# Patient Record
Sex: Female | Born: 1935 | Race: White | Hispanic: No | Marital: Married | Smoking: Never smoker
Health system: Southern US, Community
[De-identification: ages and names within clinical notes are randomized; demographics above are authoritative.]

## PROBLEM LIST (undated history)

## (undated) DIAGNOSIS — R011 Cardiac murmur, unspecified: Secondary | ICD-10-CM

## (undated) DIAGNOSIS — M545 Low back pain, unspecified: Secondary | ICD-10-CM

## (undated) DIAGNOSIS — G43909 Migraine, unspecified, not intractable, without status migrainosus: Secondary | ICD-10-CM

## (undated) DIAGNOSIS — Z9289 Personal history of other medical treatment: Secondary | ICD-10-CM

## (undated) DIAGNOSIS — E119 Type 2 diabetes mellitus without complications: Secondary | ICD-10-CM

## (undated) DIAGNOSIS — I8390 Asymptomatic varicose veins of unspecified lower extremity: Secondary | ICD-10-CM

## (undated) DIAGNOSIS — M810 Age-related osteoporosis without current pathological fracture: Secondary | ICD-10-CM

## (undated) DIAGNOSIS — I739 Peripheral vascular disease, unspecified: Secondary | ICD-10-CM

## (undated) DIAGNOSIS — D649 Anemia, unspecified: Secondary | ICD-10-CM

## (undated) DIAGNOSIS — I350 Nonrheumatic aortic (valve) stenosis: Secondary | ICD-10-CM

## (undated) DIAGNOSIS — R0789 Other chest pain: Secondary | ICD-10-CM

## (undated) DIAGNOSIS — I82409 Acute embolism and thrombosis of unspecified deep veins of unspecified lower extremity: Secondary | ICD-10-CM

## (undated) DIAGNOSIS — F329 Major depressive disorder, single episode, unspecified: Secondary | ICD-10-CM

## (undated) DIAGNOSIS — K589 Irritable bowel syndrome without diarrhea: Secondary | ICD-10-CM

## (undated) DIAGNOSIS — Z7901 Long term (current) use of anticoagulants: Secondary | ICD-10-CM

## (undated) DIAGNOSIS — I2510 Atherosclerotic heart disease of native coronary artery without angina pectoris: Secondary | ICD-10-CM

## (undated) DIAGNOSIS — F32A Depression, unspecified: Secondary | ICD-10-CM

## (undated) DIAGNOSIS — C4490 Unspecified malignant neoplasm of skin, unspecified: Secondary | ICD-10-CM

## (undated) DIAGNOSIS — N200 Calculus of kidney: Secondary | ICD-10-CM

## (undated) DIAGNOSIS — M1990 Unspecified osteoarthritis, unspecified site: Secondary | ICD-10-CM

## (undated) DIAGNOSIS — K219 Gastro-esophageal reflux disease without esophagitis: Secondary | ICD-10-CM

## (undated) DIAGNOSIS — I219 Acute myocardial infarction, unspecified: Secondary | ICD-10-CM

## (undated) DIAGNOSIS — G8929 Other chronic pain: Secondary | ICD-10-CM

## (undated) DIAGNOSIS — I482 Chronic atrial fibrillation, unspecified: Secondary | ICD-10-CM

## (undated) DIAGNOSIS — F419 Anxiety disorder, unspecified: Secondary | ICD-10-CM

## (undated) DIAGNOSIS — I519 Heart disease, unspecified: Secondary | ICD-10-CM

## (undated) HISTORY — DX: Other chest pain: R07.89

## (undated) HISTORY — PX: CHOLECYSTECTOMY: SHX55

## (undated) HISTORY — DX: Long term (current) use of anticoagulants: Z79.01

## (undated) HISTORY — DX: Depression, unspecified: F32.A

## (undated) HISTORY — DX: Chronic atrial fibrillation, unspecified: I48.20

## (undated) HISTORY — DX: Asymptomatic varicose veins of unspecified lower extremity: I83.90

## (undated) HISTORY — PX: HERNIA REPAIR: SHX51

## (undated) HISTORY — DX: Major depressive disorder, single episode, unspecified: F32.9

## (undated) HISTORY — PX: SKIN CANCER EXCISION: SHX779

## (undated) HISTORY — PX: APPENDECTOMY: SHX54

## (undated) HISTORY — PX: CARDIAC CATHETERIZATION: SHX172

## (undated) HISTORY — DX: Peripheral vascular disease, unspecified: I73.9

## (undated) HISTORY — DX: Heart disease, unspecified: I51.9

## (undated) HISTORY — DX: Atherosclerotic heart disease of native coronary artery without angina pectoris: I25.10

## (undated) HISTORY — DX: Calculus of kidney: N20.0

## (undated) HISTORY — DX: Irritable bowel syndrome, unspecified: K58.9

## (undated) HISTORY — PX: UMBILICAL HERNIA REPAIR: SHX196

## (undated) HISTORY — DX: Nonrheumatic aortic (valve) stenosis: I35.0

---

## 1960-06-10 DIAGNOSIS — I219 Acute myocardial infarction, unspecified: Secondary | ICD-10-CM

## 1960-06-10 DIAGNOSIS — I82409 Acute embolism and thrombosis of unspecified deep veins of unspecified lower extremity: Secondary | ICD-10-CM

## 1960-06-10 HISTORY — DX: Acute myocardial infarction, unspecified: I21.9

## 1960-06-10 HISTORY — DX: Acute embolism and thrombosis of unspecified deep veins of unspecified lower extremity: I82.409

## 1969-02-08 HISTORY — PX: ABDOMINAL HYSTERECTOMY: SHX81

## 1969-06-10 DIAGNOSIS — Z9289 Personal history of other medical treatment: Secondary | ICD-10-CM

## 1969-06-10 HISTORY — PX: PATENT FORAMEN OVALE CLOSURE: SHX2181

## 1969-06-10 HISTORY — DX: Personal history of other medical treatment: Z92.89

## 1997-11-07 ENCOUNTER — Inpatient Hospital Stay (HOSPITAL_COMMUNITY): Admission: EM | Admit: 1997-11-07 | Discharge: 1997-11-08 | Payer: Self-pay | Admitting: *Deleted

## 1999-02-26 ENCOUNTER — Encounter: Payer: Self-pay | Admitting: *Deleted

## 1999-02-26 ENCOUNTER — Ambulatory Visit (HOSPITAL_COMMUNITY): Admission: RE | Admit: 1999-02-26 | Discharge: 1999-02-26 | Payer: Self-pay | Admitting: *Deleted

## 1999-03-30 ENCOUNTER — Encounter: Payer: Self-pay | Admitting: Urology

## 1999-03-30 ENCOUNTER — Ambulatory Visit (HOSPITAL_COMMUNITY): Admission: RE | Admit: 1999-03-30 | Discharge: 1999-03-30 | Payer: Self-pay | Admitting: Urology

## 2000-08-25 ENCOUNTER — Encounter: Payer: Self-pay | Admitting: *Deleted

## 2000-08-25 ENCOUNTER — Inpatient Hospital Stay (HOSPITAL_COMMUNITY): Admission: AD | Admit: 2000-08-25 | Discharge: 2000-08-30 | Payer: Self-pay | Admitting: *Deleted

## 2000-10-08 HISTORY — PX: MITRAL VALVE REPLACEMENT: SHX147

## 2000-10-08 HISTORY — PX: TRICUSPID VALVE REPLACEMENT: SHX816

## 2000-10-20 ENCOUNTER — Inpatient Hospital Stay (HOSPITAL_COMMUNITY): Admission: RE | Admit: 2000-10-20 | Discharge: 2000-11-04 | Payer: Self-pay | Admitting: Surgery

## 2000-10-20 ENCOUNTER — Encounter (INDEPENDENT_AMBULATORY_CARE_PROVIDER_SITE_OTHER): Payer: Self-pay | Admitting: Specialist

## 2000-10-20 ENCOUNTER — Encounter: Payer: Self-pay | Admitting: Surgery

## 2000-10-23 ENCOUNTER — Encounter: Payer: Self-pay | Admitting: Surgery

## 2000-10-24 ENCOUNTER — Encounter: Payer: Self-pay | Admitting: Surgery

## 2000-10-25 ENCOUNTER — Encounter: Payer: Self-pay | Admitting: Surgery

## 2000-10-26 ENCOUNTER — Encounter: Payer: Self-pay | Admitting: Cardiothoracic Surgery

## 2000-10-27 ENCOUNTER — Encounter: Payer: Self-pay | Admitting: Cardiothoracic Surgery

## 2001-01-08 ENCOUNTER — Inpatient Hospital Stay (HOSPITAL_COMMUNITY): Admission: AD | Admit: 2001-01-08 | Discharge: 2001-01-11 | Payer: Self-pay | Admitting: Cardiology

## 2001-01-09 ENCOUNTER — Encounter: Payer: Self-pay | Admitting: Cardiology

## 2001-01-09 ENCOUNTER — Encounter: Payer: Self-pay | Admitting: Neurology

## 2002-04-02 ENCOUNTER — Ambulatory Visit (HOSPITAL_COMMUNITY): Admission: RE | Admit: 2002-04-02 | Discharge: 2002-04-02 | Payer: Self-pay

## 2002-04-02 ENCOUNTER — Encounter: Payer: Self-pay | Admitting: Urology

## 2004-06-15 ENCOUNTER — Ambulatory Visit: Payer: Self-pay | Admitting: Cardiology

## 2004-12-03 ENCOUNTER — Ambulatory Visit: Payer: Self-pay | Admitting: Cardiology

## 2005-03-07 ENCOUNTER — Ambulatory Visit: Payer: Self-pay | Admitting: Cardiology

## 2005-06-26 ENCOUNTER — Ambulatory Visit: Payer: Self-pay | Admitting: Cardiology

## 2005-07-05 ENCOUNTER — Ambulatory Visit: Payer: Self-pay | Admitting: Cardiology

## 2006-04-24 ENCOUNTER — Emergency Department (HOSPITAL_COMMUNITY): Admission: EM | Admit: 2006-04-24 | Discharge: 2006-04-24 | Payer: Self-pay | Admitting: Emergency Medicine

## 2006-04-24 ENCOUNTER — Encounter: Payer: Self-pay | Admitting: Vascular Surgery

## 2006-07-14 ENCOUNTER — Encounter: Payer: Self-pay | Admitting: Cardiology

## 2006-07-15 ENCOUNTER — Ambulatory Visit: Payer: Self-pay | Admitting: Cardiology

## 2006-07-22 ENCOUNTER — Ambulatory Visit: Payer: Self-pay | Admitting: Cardiology

## 2006-07-25 ENCOUNTER — Ambulatory Visit: Payer: Self-pay | Admitting: Cardiology

## 2006-10-02 ENCOUNTER — Ambulatory Visit: Payer: Self-pay | Admitting: Cardiology

## 2006-12-01 ENCOUNTER — Ambulatory Visit: Payer: Self-pay | Admitting: Cardiology

## 2006-12-09 ENCOUNTER — Ambulatory Visit: Payer: Self-pay | Admitting: Cardiology

## 2006-12-18 ENCOUNTER — Ambulatory Visit: Payer: Self-pay | Admitting: Physician Assistant

## 2006-12-19 ENCOUNTER — Encounter: Payer: Self-pay | Admitting: Cardiology

## 2006-12-23 ENCOUNTER — Ambulatory Visit: Payer: Self-pay | Admitting: Cardiology

## 2006-12-26 ENCOUNTER — Ambulatory Visit: Payer: Self-pay | Admitting: Cardiology

## 2006-12-29 ENCOUNTER — Ambulatory Visit: Payer: Self-pay | Admitting: Cardiology

## 2006-12-31 ENCOUNTER — Ambulatory Visit: Payer: Self-pay | Admitting: Cardiology

## 2007-01-09 ENCOUNTER — Ambulatory Visit: Payer: Self-pay | Admitting: Physician Assistant

## 2007-03-04 ENCOUNTER — Encounter: Payer: Self-pay | Admitting: Cardiology

## 2007-05-05 ENCOUNTER — Ambulatory Visit: Payer: Self-pay | Admitting: Cardiology

## 2007-06-12 ENCOUNTER — Ambulatory Visit: Payer: Self-pay | Admitting: Cardiology

## 2007-06-19 ENCOUNTER — Ambulatory Visit: Payer: Self-pay | Admitting: Cardiology

## 2007-06-24 ENCOUNTER — Ambulatory Visit: Payer: Self-pay | Admitting: Cardiology

## 2007-06-26 ENCOUNTER — Ambulatory Visit: Payer: Self-pay | Admitting: Cardiology

## 2007-10-29 ENCOUNTER — Encounter: Payer: Self-pay | Admitting: Cardiology

## 2007-12-08 ENCOUNTER — Ambulatory Visit: Payer: Self-pay | Admitting: Cardiology

## 2008-03-25 ENCOUNTER — Encounter: Payer: Self-pay | Admitting: Cardiology

## 2008-07-18 ENCOUNTER — Ambulatory Visit: Payer: Self-pay | Admitting: Cardiology

## 2008-07-18 ENCOUNTER — Encounter: Payer: Self-pay | Admitting: Cardiology

## 2008-07-18 DIAGNOSIS — G43909 Migraine, unspecified, not intractable, without status migrainosus: Secondary | ICD-10-CM | POA: Insufficient documentation

## 2008-07-18 DIAGNOSIS — I4891 Unspecified atrial fibrillation: Secondary | ICD-10-CM | POA: Insufficient documentation

## 2008-07-18 DIAGNOSIS — R002 Palpitations: Secondary | ICD-10-CM | POA: Insufficient documentation

## 2008-07-18 DIAGNOSIS — I079 Rheumatic tricuspid valve disease, unspecified: Secondary | ICD-10-CM | POA: Insufficient documentation

## 2008-07-18 DIAGNOSIS — F3289 Other specified depressive episodes: Secondary | ICD-10-CM | POA: Insufficient documentation

## 2008-07-18 DIAGNOSIS — Z9889 Other specified postprocedural states: Secondary | ICD-10-CM

## 2008-07-18 DIAGNOSIS — R413 Other amnesia: Secondary | ICD-10-CM | POA: Insufficient documentation

## 2008-08-18 ENCOUNTER — Encounter: Payer: Self-pay | Admitting: Cardiology

## 2009-01-02 ENCOUNTER — Ambulatory Visit: Payer: Self-pay | Admitting: Cardiology

## 2009-01-02 ENCOUNTER — Encounter: Payer: Self-pay | Admitting: Cardiology

## 2009-01-02 DIAGNOSIS — D500 Iron deficiency anemia secondary to blood loss (chronic): Secondary | ICD-10-CM

## 2009-01-19 ENCOUNTER — Ambulatory Visit: Payer: Self-pay | Admitting: Cardiology

## 2009-01-19 ENCOUNTER — Encounter: Payer: Self-pay | Admitting: Cardiology

## 2009-04-27 ENCOUNTER — Encounter: Payer: Self-pay | Admitting: Cardiology

## 2009-09-01 ENCOUNTER — Encounter: Payer: Self-pay | Admitting: Cardiology

## 2009-10-10 ENCOUNTER — Ambulatory Visit: Payer: Self-pay | Admitting: Cardiology

## 2010-01-22 ENCOUNTER — Telehealth (INDEPENDENT_AMBULATORY_CARE_PROVIDER_SITE_OTHER): Payer: Self-pay | Admitting: *Deleted

## 2010-04-11 ENCOUNTER — Encounter: Payer: Self-pay | Admitting: Cardiology

## 2010-04-12 ENCOUNTER — Encounter: Payer: Self-pay | Admitting: Cardiology

## 2010-04-25 ENCOUNTER — Encounter (INDEPENDENT_AMBULATORY_CARE_PROVIDER_SITE_OTHER): Payer: Self-pay | Admitting: *Deleted

## 2010-04-25 ENCOUNTER — Ambulatory Visit: Payer: Self-pay | Admitting: Physician Assistant

## 2010-04-25 DIAGNOSIS — R079 Chest pain, unspecified: Secondary | ICD-10-CM

## 2010-04-25 DIAGNOSIS — R0602 Shortness of breath: Secondary | ICD-10-CM

## 2010-04-27 ENCOUNTER — Ambulatory Visit: Payer: Self-pay | Admitting: Cardiology

## 2010-04-27 ENCOUNTER — Encounter: Payer: Self-pay | Admitting: Physician Assistant

## 2010-05-10 ENCOUNTER — Ambulatory Visit: Payer: Self-pay | Admitting: Cardiology

## 2010-07-02 ENCOUNTER — Encounter: Payer: Self-pay | Admitting: Cardiology

## 2010-07-06 ENCOUNTER — Telehealth (INDEPENDENT_AMBULATORY_CARE_PROVIDER_SITE_OTHER): Payer: Self-pay | Admitting: *Deleted

## 2010-07-09 ENCOUNTER — Ambulatory Visit
Admission: RE | Admit: 2010-07-09 | Discharge: 2010-07-09 | Payer: Self-pay | Source: Home / Self Care | Attending: Cardiology | Admitting: Cardiology

## 2010-07-09 DIAGNOSIS — R5381 Other malaise: Secondary | ICD-10-CM | POA: Insufficient documentation

## 2010-07-09 DIAGNOSIS — R5383 Other fatigue: Secondary | ICD-10-CM

## 2010-07-09 DIAGNOSIS — I951 Orthostatic hypotension: Secondary | ICD-10-CM | POA: Insufficient documentation

## 2010-07-09 DIAGNOSIS — I059 Rheumatic mitral valve disease, unspecified: Secondary | ICD-10-CM | POA: Insufficient documentation

## 2010-07-10 NOTE — Letter (Signed)
Summary: Lexiscan or Dobutamine Pharmacist, community at [facility]  [address]   [place], [place] [address]   Phone: [phone]  Fax: [phone]      [facility]  Lexiscan or Dobutamine Cardiolite Strss Test    [place]  Appointment Date:_  Appointment Time:_  Your doctor has ordered a CARDIOLITE STRESS TEST using a medication to stimulate exercise so that you will not have to walk on the treadmill to determine the condition of your heart during stress. If you take blood pressure medication, ask your doctor if you should take it the day of your test. You should not have anything to eat or drink at least 4 hours before your test is scheduled, and no caffeine, including decaffeinated tea and coffee, chocolate, and soft drinks for 24 hours before your test.  You will need to register at the Outpatient/Main Entrance at the hospital 15 minutes before your appointment time. It is a good idea to bring a copy of your order with you. They will direct you to the Diagnostic Imaging (Radiology) Department.  You will be asked to undress from the waist up and given a hospital gown to wear, so dress comfortably from the waist down for example: Sweat pants, shorts, or skirt Rubber soled lace up shoes (tennis shoes)  Plan on about three hours from registration to release from the hospital  You may take your medications with water the morning of your test.

## 2010-07-10 NOTE — Assessment & Plan Note (Signed)
Summary: 6 MO FU R/S FROM NO SHOW   Visit Type:  Follow-up Primary Provider:  Dr. Sherril Croon  CC:  some sob .  History of Present Illness: the patient is a 77 year old female with a history of mitral and tricuspid valve replacement. The patient denies any chest pain. She does report some increasing shortness of breath over the last month. This appears to occur mainly at rest. She states that she is under a lot of stress. She does become dyspneic when she gets anxious. She denies however any exertional chest pain or shortness of breath. From a cardiovascular point she's stable. She reports no palpitations presyncope or syncope.she has a history of atrial fibrillation but has remained in normal sinus rhythm.  Clinical Review Panels:  Echocardiogram Echocardiogram Transthoracic Echocardiogram                Conclusions:         1. The study quality is good.          2. The left ventricular chamber size is normal.         3. Global left ventricular wall motion and contractility are within         normal limits. The         4. EF is 55%.         5. There is mild aortic regurgitation.          6. There is thickening of the aortic valve with decreased opening. The         right cusp moves better than the other cusps. There is very mild AS.         7. A mechanical prosthetic mitral valve is present. It appears to work         well.         8. A pericardial effusion is visualized.         9. There is a small pericardial effusion.         10. The pericardial effusion is seen adjacent to the left ventricle.         11. The inferior vena cava appears normal.         12. There is a greater than 50% respiratory change in the inferior         vena cava dimension.          JEFFREY NMN KATZ, MD       (01/19/2009)    Current Medications (verified): 1)  Metoprolol Succinate 100 Mg Xr24h-Tab (Metoprolol Succinate) .... Take One Tablet By Mouth Daily 2)  Warfarin Sodium 5 Mg Tabs (Warfarin Sodium) ....  Use As Directed By Dr. Sherril Croon 3)  Glycron 3 Mg Tabs (Glyburide Micronized) .... Take 1 Tab Two Times A Day 4)  Furosemide 40 Mg Tabs (Furosemide) .... Take One Tablet By Mouth Daily. 5)  Evista 60 Mg Tabs (Raloxifene Hcl) .... Take 1 Tablet By Mouth Once A Day 6)  Nexium 40 Mg Cpdr (Esomeprazole Magnesium) .... Take 1 Tablet By Mouth Once A Day 7)  Klor-Con 10 10 Meq Cr-Tabs (Potassium Chloride) .... Take 1 Tab Daily 8)  Ultracet 37.5-325 Mg Tabs (Tramadol-Acetaminophen) .... As Needed Pain 9)  Alprazolam 0.5 Mg Tabs (Alprazolam) .... As Needed 10)  Stool Softener 100 Mg Caps (Docusate Sodium) .... As Needed Constipation 11)  Metoprolol Tartrate 50 Mg Tabs (Metoprolol Tartrate) .... Take 1/2 Tablet By Mouth 12)  Fexofenadine Hcl 180 Mg Tabs (Fexofenadine Hcl) .... Take 1 Tab  Daily 13)  Ultracet 37.5-325 Mg Tabs (Tramadol-Acetaminophen) .... Take As Directed 14)  Simvastatin 40 Mg Tabs (Simvastatin) .... Take 1 Tab Daily 15)  Metoprolol Tartrate 25 Mg Tabs (Metoprolol Tartrate) .... May Use Up To Two Times A Day As Needed  Allergies (verified): No Known Drug Allergies  Past History:  Past Medical History: Last updated: 07/18/2008 atrial fibrillation and atrial flutter  Rheumatic heart disease chronic Coumadin therapy As he is in a status post St. Jude's mitral tricuspid valve replacement May of 2002 Closure of patent foramen ovale valve surgery Normal LV function History of congestive heart failure Normal coronary arteries by catheterization March 2002 chronic dyspnea on exertion stable dyslipidemia Diabetes mellitus  Family History: Last updated: 01/02/2009 noncontributory  Social History: Last updated: 01/02/2009 the patient denies any tobacco use  Risk Factors: Smoking Status: never (01/02/2009)  Review of Systems       The patient complains of shortness of breath and anxiety.  The patient denies fatigue, malaise, fever, weight gain/loss, vision loss, decreased hearing,  hoarseness, chest pain, palpitations, prolonged cough, wheezing, sleep apnea, coughing up blood, abdominal pain, blood in stool, nausea, vomiting, diarrhea, heartburn, incontinence, blood in urine, muscle weakness, joint pain, leg swelling, rash, skin lesions, headache, fainting, dizziness, depression, enlarged lymph nodes, easy bruising or bleeding, and environmental allergies.    Vital Signs:  Patient profile:   77 year old female Weight:      159 pounds BMI:     27.39 Pulse rate:   68 / minute BP sitting:   94 / 54  (right arm)  Vitals Entered By: Dreama Saa, CNA (Oct 10, 2009 9:58 AM)  Physical Exam  Additional Exam:  General: Well-developed, well-nourished in no distress head: Normocephalic and atraumatic eyes PERRLA/EOMI intact, conjunctiva and lids normal nose: No deformity or lesions mouth normal dentition, normal posterior pharynx neck: Supple, no JVD.  No masses, thyromegaly or abnormal cervical nodes lungs: Normal breath sounds bilaterally without wheezing.  Normal percussion heart: regular rate and rhythm with normal closing click of S1 and normal S2.Marland Kitchen  PMI is normal.  No pathological murmurs abdomen: Normal bowel sounds, abdomen is soft and nontender without masses, organomegaly or hernias noted.  No hepatosplenomegaly musculoskeletal: Back normal, normal gait muscle strength and tone normal pulsus: Pulse is normal in all 4 extremities Extremities: No peripheral pitting edema neurologic: Alert and oriented x 3 skin: Intact without lesions or rashes cervical nodes: No significant adenopathy psychologic: Normal affect    Impression & Recommendations:  Problem # 1:  ATRIAL FLUTTER, CHRONIC (ICD-427.32) no recurrent atrial arrhythmias. The patient remains in normal sinus rhythm.the patient can use p.r.n. the Toprol for palpitations as needed Her updated medication list for this problem includes:    Metoprolol Succinate 100 Mg Xr24h-tab (Metoprolol succinate) .Marland Kitchen...  Take one tablet by mouth daily    Warfarin Sodium 5 Mg Tabs (Warfarin sodium) ..... Use as directed by dr. Sherril Croon    Metoprolol Tartrate 50 Mg Tabs (Metoprolol tartrate) .Marland Kitchen... Take 1/2 tablet by mouth    Metoprolol Tartrate 25 Mg Tabs (Metoprolol tartrate) ..... May use up to two times a day as needed  Problem # 2:  COUMADIN THERAPY (ICD-V58.61) patient will continue on Coumadin for atrial fibrillation and prosthetic valve replacement  Problem # 3:  MITRAL VALVE REPLACEMENT, HX OF (ICD-V15.1) no pathological murmurs noted  Problem # 4:  TRICUSPID VALVE DISORDER (ICD-397.0) no pathological murmurs noted. Her updated medication list for this problem includes:    Metoprolol  Succinate 100 Mg Xr24h-tab (Metoprolol succinate) .Marland Kitchen... Take one tablet by mouth daily    Furosemide 40 Mg Tabs (Furosemide) .Marland Kitchen... Take one tablet by mouth daily.    Metoprolol Tartrate 50 Mg Tabs (Metoprolol tartrate) .Marland Kitchen... Take 1/2 tablet by mouth    Metoprolol Tartrate 25 Mg Tabs (Metoprolol tartrate) ..... May use up to two times a day as needed straight leg raising at an [place] and a half hours continue her son who consulted and he is asking me to assist in this patient with some benign to my notes and notes from  Patient Instructions: 1)  Metoprolol tart 25mg  - may use up to two times a day as needed  2)  Follow up in  6 months Prescriptions: METOPROLOL TARTRATE 25 MG TABS (METOPROLOL TARTRATE) may use up to two times a day as needed  #30 x 2   Entered by:   Hoover Brunette, LPN   Authorized by:   Lewayne Bunting, MD, [facility]   Signed by:   Hoover Brunette, LPN on 16/03/9603   Method used:   Electronically to        [facility]* (retail)       [address]       [place], [place]  [address]       Ph: [phone]       Fax: [phone]   RxID:   [id number]

## 2010-07-10 NOTE — Medication Information (Signed)
Summary: RX Folder/ [facility]S [facility]  RX Folder/ [facility]   Imported By: Dorise Hiss 04/12/2010 16:18:58  _____________________________________________________________________  External Attachment:    Type:   Image     Comment:   External Document

## 2010-07-10 NOTE — Letter (Signed)
Summary: External Correspondence/ OFFICE VISIT [facility]  External Correspondence/ OFFICE VISIT [facility]   Imported By: Dorise Hiss 04/12/2010 16:17:07  _____________________________________________________________________  External Attachment:    Type:   Image     Comment:   External Document

## 2010-07-10 NOTE — Assessment & Plan Note (Signed)
Summary: 6 mo fu vyas requested sooner appt   Visit Type:  Follow-up Primary Provider:  Dr. Sherril Croon   History of Present Illness: patient presents today ahead of scheduled follow up with Dr. Andee Lineman later this month, per Dr. Bonnita Levan request. She was last seen here in the clinic in May, by Dr. Andee Lineman.  Patient complains of persistent dyspnea "all the time", for the past month. She also has occasional chest pain, with a particularly severe episode 3 weeks ago. This occurred while sitting, and felt like a "cramp", with some extension into the left arm. It lasted 5 minutes.   Patient was recently seen in the clinic by Dr. Sherril Croon; labs were ordered, and notable for potassium 4.8, BUN 23, creatinine 1.4, hemoglobin 10.5, and hematocrit 32.  Patient also complains of occasional palpitations. EKG today suggest atypical atrial flutter with controlled VR. She has history of PAF/flutter, and was in NSR when last seen.  [facility], and was advised to have at least one tooth pulled, in addition to general cleaning. This is scheduled for December 1. They are awaiting clearance from our standpoint to proceed.  Preventive Screening-Counseling & Management  Alcohol-Tobacco     Smoking Status: never  Current Medications (verified): 1)  Metoprolol Succinate 100 Mg Xr24h-Tab (Metoprolol Succinate) .... Take One Tablet By Mouth Daily 2)  Warfarin Sodium 5 Mg Tabs (Warfarin Sodium) .... Use As Directed By Dr. Sherril Croon 3)  Glycron 3 Mg Tabs (Glyburide Micronized) .... Take 1 Tab Two Times A Day 4)  Furosemide 40 Mg Tabs (Furosemide) .... Take One Tablet By Mouth Daily. 5)  Evista 60 Mg Tabs (Raloxifene Hcl) .... Take 1 Tablet By Mouth Once A Day 6)  Nexium 40 Mg Cpdr (Esomeprazole Magnesium) .... Take 1 Tablet By Mouth Once A Day 7)  Klor-Con 10 10 Meq Cr-Tabs (Potassium Chloride) .... Take 1 Tab Daily 8)  Ultracet 37.5-325 Mg Tabs (Tramadol-Acetaminophen) .... As  Needed Pain 9)  Alprazolam 0.5 Mg Tabs (Alprazolam) .... As Needed 10)  Stool Softener 100 Mg Caps (Docusate Sodium) .... As Needed Constipation 11)  Metoprolol Tartrate 50 Mg Tabs (Metoprolol Tartrate) .... Take 1/2 Tablet By Mouth 12)  Fexofenadine Hcl 180 Mg Tabs (Fexofenadine Hcl) .... Take 1 Tab Daily 13)  Ultracet 37.5-325 Mg Tabs (Tramadol-Acetaminophen) .... Take As Directed 14)  Simvastatin 40 Mg Tabs (Simvastatin) .... Take 1 Tab Daily 15)  Metoprolol Tartrate 25 Mg Tabs (Metoprolol Tartrate) .... May Use Up To Two Times A Day As Needed  Allergies (verified): No Known Drug Allergies  Comments:  Nurse/Medical Assistant: The patient is currently on medications but does not know the name or dosage at this time. Instructed to contact our office with details. Will update medication list at that time.  Past History:  Past Medical History: Last updated: 07/18/2008 atrial fibrillation and atrial flutter  Rheumatic heart disease chronic Coumadin therapy As he is in a status post St. Jude's mitral tricuspid valve replacement May of 2002 Closure of patent foramen ovale valve surgery Normal LV function History of congestive heart failure Normal coronary arteries by catheterization March 2002 chronic dyspnea on exertion stable dyslipidemia Diabetes mellitus  Review of Systems       No fevers, chills, hemoptysis, dysphagia, melena, hematocheezia, hematuria, rash, claudication, orthopnea, pnd, pedal edema. complains of chronic fatigue. All other systems negative.   Vital Signs:  Patient profile:   77 year old female Height:      64 inches  Weight:      156 pounds Pulse rate:   85 / minute BP sitting:   99 / 63  (left arm) Cuff size:   regular  Vitals Entered By: Carlye Grippe (April 25, 2010 1:59 PM)   Physical Exam  Additional Exam:  GEN: 77 year old female, frail appearing, but in no distress HEENT: NCAT,PERRLA,EOMI NECK: palpable pulses, no bruits; no JVD; no  TM LUNGS: CTA bilaterally HEART: RRR (S1S2); grade 2/6 systolic murmur; crisp S1 click ABD: soft, NT; intact BS EXT: intact distal pulses; no edema SKIN: warm, dry MUSC: no obvious deformity NEURO: A/O (x3)     EKG  Procedure date:  04/25/2010  Findings:      atypical atrial flutter at 76 bpm with variable conduction; isolated PVC, and nonspecific ST changes  Impression & Recommendations:  Problem # 1:  CHEST PAIN (ICD-786.50)  Will evaluate further with a Lexis scan Cardiolite to rule out underlying ischemia. Patient presents with persistent shortness of breath, which may be an anginal equivalent. she has history of normal coronary arteries by catheterization in 2002, and had a negative Cardiolite in 2009. We will also order a 2-D echocardiogram for reassessment of LVF, and integrity of her prosthetic valves. Of note, she is on chronic Coumadin, followed by Dr. Sherril Croon, and would require heparin overlap, if she needs to come off Coumadin for any dental procedure. We will have the patient return to the clinic in 2 weeks, per review of study results, and we'll then make final recommendations regarding scheduled dental surgery.  Problem # 2:  SHORTNESS OF BREATH (ICD-786.05)  patient has history of normal left ventricular function and normal coronary arteries. Proceed with diagnostic testing, as outlined above  Problem # 3:  ATRIAL FLUTTER, CHRONIC (ICD-427.32)  patient currently is in atypical flutter rhythm with controlled VR. She is on chronic Coumadin. This may be related to her recent development of persistent dyspnea  Problem # 4:  MITRAL VALVE REPLACEMENT, HX OF (ICD-V15.1) Assessment: Comment Only  Other Orders: EKG w/ Interpretation (93000) 2-D Echocardiogram (2D Echo) Nuclear Med (Nuc Med)  Patient Instructions: 1)  Follow up with Dr. Earnestine Leys on [place], Dec 1st at 2:30pm 2)  Your physician has requested that you have an echocardiogram.  Echocardiography is a painless  test that uses sound waves to create images of your heart. It provides your doctor with information about the size and shape of your heart and how well your heart's chambers and valves are working.  This procedure takes approximately one hour. There are no restrictions for this procedure. 3)  Your physician has requested that you have an Best boy.  For further information please visit [email or web address]  Please follow instruction sheet, as given.

## 2010-07-10 NOTE — Progress Notes (Signed)
Summary: feeling tired   Phone Note Call from Patient   Summary of Call: Message left on voice mail late Friday evening.  Attempted to reach pt this morning. Left message to return call at 9:53.  Return call left on voice mail. Attempted to reach pt. again, but phone was busy. Hoover Brunette, LPN  January 22, 2010 3:17 PM   Follow-up for Phone Call        States that she has just not been feeling good lately, no energy, tired, run down feeling.  Requesting to see GD on the same day as her husband on 8/26.  Dr. Andee Lineman does not have any other appointments available that day.  Advised her that we had just seen her in May and her cardiac condition was stable.  States she has only used her as needed Metoprolol occas.   Advised her to see her PMD for evaluation and if he feels you need to be seen sooner, have him call to request.  Patient verbalized understanding.  Follow-up by: Hoover Brunette, LPN,  January 23, 2010 10:26 AM

## 2010-07-10 NOTE — Letter (Signed)
Summary: Appointment -missed   HeartCare at [facility] [address]   [place], [place] [address]   Phone: [phone]  Fax: [phone]     September 01, 2009 MRN: [id number]     Elizabeth Wallace [address] [place], [place]  [address]      Dear [name],  Our records indicate you missed your appointment on September 01, 2009                        with Dr.  Andee Lineman.   It is very important that we reach you to reschedule this appointment. We look forward to participating in your health care needs.   Please contact us at the number listed above at your earliest convenience to reschedule this appointment.   Sincerely,    Glass blower/designer

## 2010-07-11 ENCOUNTER — Encounter: Payer: Self-pay | Admitting: Cardiology

## 2010-07-12 NOTE — Assessment & Plan Note (Signed)
Summary: 2 WK F/U PER GENE-MUST SEE GD ONLY-JM   Visit Type:  Follow-up Primary Broderic Bara:  Dr. 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 She also had a closure of a patent foramen ovale. She has normal LV function and normal coronary arteries by catheterization March of 2002.the patient had a recent Cardiolite study with small apical defect. However she has no recurrent substernal chest pain and she is being treated medically. An echocardiogram demonstrated moderate aortic stenosis and a well-seated mitral valve and tricuspid valve replacement. She remains in chronic atrial fibrillation. At this point we are not proceeding with cardioversion. The patient does not take her stat and every day because she feels it is related to gas and bloating. She is in process of rescheduling colonoscopy. I explained to the patient that she will need bridging in the setting of her mitral valve and tricuspid valve replacement. She reports no bowel symptoms that could be consistent with IBS.  The cardiac perspective she started doing quite well. She has no palpitations. She got chronic mild dyspnea. She denies any palpitations or syncope  Preventive Screening-Counseling & Management  Alcohol-Tobacco     Smoking Status: never  Current Medications (verified): 1)  Metoprolol Succinate 100 Mg Xr24h-Tab (Metoprolol Succinate) .... Take One Tablet By Mouth Daily 2)  Warfarin Sodium 5 Mg Tabs (Warfarin Sodium) .... Use As Directed By Dr. Sherril Croon 3)  Glycron 3 Mg Tabs (Glyburide Micronized) .... Take 1 Tab Two Times A Day 4)  Furosemide 40 Mg Tabs (Furosemide) .... Take One Tablet By Mouth Daily. 5)  Evista 60 Mg Tabs (Raloxifene Hcl) .... Take 1 Tablet By Mouth Once A Day 6)  Nexium 40 Mg Cpdr (Esomeprazole Magnesium) .... Take 1 Tablet By Mouth Once A Day 7)  Klor-Con 10 10 Meq Cr-Tabs (Potassium  Chloride) .... Take 1 Tab Daily 8)  Ultracet 37.5-325 Mg Tabs (Tramadol-Acetaminophen) .... As Needed Pain 9)  Alprazolam 0.5 Mg Tabs (Alprazolam) .... As Needed 10)  Stool Softener 100 Mg Caps (Docusate Sodium) .... As Needed Constipation 11)  Metoprolol Tartrate 50 Mg Tabs (Metoprolol Tartrate) .... Take 1/2 Tablet By Mouth 12)  Fexofenadine Hcl 180 Mg Tabs (Fexofenadine Hcl) .... Take 1 Tab Daily 13)  Ultracet 37.5-325 Mg Tabs (Tramadol-Acetaminophen) .... Take As Directed 14)  Simvastatin 40 Mg Tabs (Simvastatin) .... Take 1 Tab Daily 15)  Metoprolol Tartrate 25 Mg Tabs (Metoprolol Tartrate) .... May Use Up To Two Times A Day As Needed 16)  Dicyclomine Hcl 20 Mg Tabs (Dicyclomine Hcl) .... May Take One Tab Up Three Times A Day  60 Minutes Prior To Meals As Needed  Allergies (verified): No Known Drug Allergies  Comments:  Nurse/Medical Assistant: The patient is currently on medications but does not know the name or dosage at this time. Instructed to contact our office with details. Will update medication list at that time.  Past History:  Past Medical History: Last updated: 07/18/2008 atrial fibrillation and atrial flutter  Rheumatic heart disease chronic Coumadin therapy As he is in a status post St. Jude's mitral tricuspid valve replacement May of 2002 Closure of patent foramen ovale valve surgery Normal LV function History of congestive heart failure Normal coronary arteries by catheterization March 2002 chronic dyspnea on exertion stable dyslipidemia Diabetes mellitus  Family History: Last updated: 05/10/2010 Negative FH of Diabetes, Hypertension, or Coronary Artery Disease  Social History: Last updated:  01/02/2009 the patient denies any tobacco use  Risk Factors: Smoking Status: never (05/10/2010)  Family History: Reviewed history from 01/02/2009 and no changes required. Negative FH of Diabetes, Hypertension, or Coronary Artery Disease  Social  History: Reviewed history from 01/02/2009 and no changes required. the patient denies any tobacco use  Vital Signs:  Patient profile:   77 year old female Height:      64 inches Weight:      156 pounds Pulse rate:   66 / minute BP sitting:   96 / 67  (left arm) Cuff size:   large  Vitals Entered By: Carlye Grippe (May 10, 2010 2:23 PM)  Physical Exam  Additional Exam:  GEN: 77 year old female, frail appearing, but in no distress HEENT: NCAT,PERRLA,EOMI NECK: palpable pulses, no bruits; no JVD; no TM LUNGS: CTA bilaterally HEART: RRR (S1S2); grade 2/6 systolic murmur; crisp S1 click ABD: soft, NT; intact BS EXT: intact distal pulses; no edema SKIN: warm, dry MUSC: no obvious deformity NEURO: A/O (x3)     Impression & Recommendations:  Problem # 1:  SHORTNESS OF BREATH (ICD-786.05) stable no evidence of heart failure. Continue current dose of Lasix. Her updated medication list for this problem includes:    Metoprolol Succinate 100 Mg Xr24h-tab (Metoprolol succinate) .Marland Kitchen... Take one tablet by mouth daily    Furosemide 40 Mg Tabs (Furosemide) .Marland Kitchen... Take one tablet by mouth daily.    Metoprolol Tartrate 50 Mg Tabs (Metoprolol tartrate) .Marland Kitchen... Take 1/2 tablet by mouth    Metoprolol Tartrate 25 Mg Tabs (Metoprolol tartrate) ..... May use up to two times a day as needed  Problem # 2:  COUMADIN THERAPY (ICD-V58.61) the patient will remain on Coumadin therapy for her mitral valve and tricuspid valve replacement. She has a mechanical prosthesis in she will need bridging with Lovenox or heparin if she scheduled for colonoscopy in the future.  Problem # 3:  MITRAL VALVE REPLACEMENT, HX OF (ICD-V15.1) no evidence of valve dysfunction.  Problem # 4:  TRICUSPID VALVE DISORDER (ICD-397.0) Assessment: Comment Only  Her updated medication list for this problem includes:    Metoprolol Succinate 100 Mg Xr24h-tab (Metoprolol succinate) .Marland Kitchen... Take one tablet by mouth daily     Furosemide 40 Mg Tabs (Furosemide) .Marland Kitchen... Take one tablet by mouth daily.    Metoprolol Tartrate 50 Mg Tabs (Metoprolol tartrate) .Marland Kitchen... Take 1/2 tablet by mouth    Metoprolol Tartrate 25 Mg Tabs (Metoprolol tartrate) ..... May use up to two times a day as needed  Other Orders: EKG w/ Interpretation (93000)  Patient Instructions: 1)  Your physician recommends that you continue on your current medications as directed. Please refer to the Current Medication list given to you today. 2)  Dicyclomine 20mg   3)  Follow up in  6 months Prescriptions: DICYCLOMINE HCL 20 MG TABS (DICYCLOMINE HCL) may take one tab up three times a day  60 minutes prior to meals as needed  #60 x 0   Entered by:   Hoover Brunette, LPN   Authorized by:   Lewayne Bunting, MD, [facility]   Signed by:   Hoover Brunette, LPN on 16/03/9603   Method used:   Electronically to        [facility] (retail)       [address]       [place], [place]  [address]       Ph: [phone]       Fax:  [phone]   RxID:   [id number] DICYCLOMINE HCL 20 MG TABS (DICYCLOMINE HCL) may take one tab up three times a day  60 minutes prior to meals as needed  #60 x 0   Entered by:   Hoover Brunette, LPN   Authorized by:   Lewayne Bunting, MD, [facility]   Signed by:   Hoover Brunette, LPN on 16/60/6301   Method used:   Electronically to        [facility] (retail)       [address]       [place], [place]  [address]       Ph: [phone]       Fax: [phone]   RxID:   [id number]  Above script to [facility] cancelled.  Patient prefers [facility] in [place]. Hoover Brunette, LPN  May 10, 2010 3:18 PM

## 2010-07-16 ENCOUNTER — Encounter: Payer: Self-pay | Admitting: Cardiology

## 2010-07-16 DIAGNOSIS — I359 Nonrheumatic aortic valve disorder, unspecified: Secondary | ICD-10-CM

## 2010-07-18 NOTE — Consult Note (Signed)
Summary: CARDIOLOGY CONSULT/ [facility]  CARDIOLOGY CONSULT/ [facility]   Imported By: Zachary George 07/09/2010 09:53:06  _____________________________________________________________________  External Attachment:    Type:   Image     Comment:   External Document

## 2010-07-18 NOTE — Assessment & Plan Note (Signed)
Summary: Elizabeth Wallace  --agh   Visit Type:  Follow-up Primary Provider:  Dr. Sherril Croon   History of Present Illness: the patient is a 77 year old female with a history of chronic atrial fibrillation, rheumatic heart disease, status post St. Jude mitral valve replacement and a bioprosthetic tricuspid valve replacement.  The patient and Coumadin therapy.  She also had closure of a patent foramen ovale.  She has normal LV function.  She had normal coronary arteries by catheterization in 2002 and a stress test approximately a year ago which showed a very small area of apical ischemia November 2011.  The patient was recently admitted to [facility] with dizziness and associated hypoglycemia blood sugar was 65 g percent.  The patient was also over anticoagulated she was placed on antibiotics.  There is no follow-up PT/INR as of date.  The patient states that she feels tired all the time.  She has frequent episodes of dizziness particularly when standing up.  She states on Friday she was trying to hang some curtains and she felt extremely weak and dizzy and almost passed out.  She denies however any true loss of consciousness.  She also talked with episodes of " cramping chest pain".  This typically lasts a few minutes but there is no exertional component associated with it.  There is also no shortness of breath or diaphoresis.  The patient stated she feels thirsty all the time.  She does take 40 monos of Lasix daily.  Her LV function however is normal.  Preventive Screening-Counseling & Management  Alcohol-Tobacco     Smoking Status: never  Current Medications (verified): 1)  Metoprolol Succinate 50 Mg Xr24h-Tab (Metoprolol Succinate) .... Take 1 1/2 Tabs (75mg ) Daily 2)  Warfarin Sodium 5 Mg Tabs (Warfarin Sodium) .... Use As Directed By Dr. Sherril Croon 3)  Glycron 3 Mg Tabs (Glyburide Micronized) .... Take 1 Tab Once A Day 4)  Furosemide 40 Mg Tabs (Furosemide) .... Take 1/2 Tab (20mg ) Daily 5)  Evista 60 Mg Tabs  (Raloxifene Hcl) .... Take 1 Tablet By Mouth Once A Day 6)  Nexium 40 Mg Cpdr (Esomeprazole Magnesium) .... Take 1 Tablet By Mouth Once A Day 7)  Klor-Con 10 10 Meq Cr-Tabs (Potassium Chloride) .... Take 1 Tab Daily 8)  Ultracet 37.5-325 Mg Tabs (Tramadol-Acetaminophen) .... As Needed Pain 9)  Alprazolam 0.5 Mg Tabs (Alprazolam) .... As Needed 10)  Simvastatin 40 Mg Tabs (Simvastatin) .... Take 1 Tab Daily 11)  Metoprolol Tartrate 25 Mg Tabs (Metoprolol Tartrate) .... May Use Up To Two Times A Day As Needed 12)  Dicyclomine Hcl 20 Mg Tabs (Dicyclomine Hcl) .... May Take One Tab Up Three Times A Day  60 Minutes Prior To Meals As Needed 13)  Carafate 1 Gm/73ml Susp (Sucralfate) .... Use Three Times A Day As Directed As Needed 14)  Miralax  Powd (Polyethylene Glycol 3350) .Marland Kitchen.. 17gm Mixed With 8oz Water or Juice Daily As Needed  Allergies (verified): 1)  ! Percocet  Comments:  Nurse/Medical Assistant: The patient's medications and allergies were verbally reviewed with the patient and were updated in the Medication and Allergy Lists.  Past History:  Past Medical History: atrial fibrillation and atrial flutter  Rheumatic heart disease chronic Coumadin therapy As he is in a status post St. Jude's mitral tricuspid valve replacement May of 2002 Closure of patent foramen ovale valve surgery Normal LV function History of congestive heart failure Normal coronary arteries by catheterization March 2002 chronic dyspnea on exertion stable dyslipidemia Diabetes mellitus admission January  2012 severe hypoglycemia, atypical chest pain ruled out for myocardial infarction as well as over anticoagulation due to recent antibiotics echocardiogram November 2011 normal LV function normal functioning mitral andtricuspid prosthesis.  Moderate aortic stenosis Cardiolite study November 2011 low risk with a very small area of apical ischemia.  Review of Systems       The patient complains of fatigue, weight  gain/loss, shortness of breath, muscle weakness, dizziness, and depression.  The patient denies malaise, fever, vision loss, decreased hearing, hoarseness, chest pain, palpitations, prolonged cough, wheezing, sleep apnea, coughing up blood, abdominal pain, blood in stool, nausea, vomiting, diarrhea, heartburn, incontinence, blood in urine, joint pain, leg swelling, rash, skin lesions, headache, fainting, anxiety, enlarged lymph nodes, easy bruising or bleeding, and environmental allergies.    Vital Signs:  Patient profile:   77 year old female Height:      64 inches Weight:      156 pounds BMI:     26.87 O2 Sat:      97 % on Room air Pulse rate:   65 / minute Pulse (ortho):   66 / minute BP sitting:   116 / 64  (left arm) BP standing:   10 / 67 Cuff size:   regular  Vitals Entered By: Carlye Grippe (July 09, 2010 10:58 AM)  Nutrition Counseling: Patient's BMI is greater than 25 and therefore counseled on weight management options.  O2 Flow:  Room air  Serial Vital Signs/Assessments:  Time      Position  BP       Pulse  Resp  Temp     By 11:58 AM  Lying RA  102/66   62                    [facility], LPN 16:10 AM  Sitting   101/60   60                    [place], LPN 96:04 AM  Standing  10/67    66                    [place], LPN  Comments: 54:09 AM after 3 min:  118/71  64 after 5 min:  11/73  65  By: Hoover Brunette, LPN    Physical Exam  Additional Exam:  GEN: 77 year old female, frail appearing, but in no distress HEENT: NCAT,PERRLA,EOMI NECK: palpable pulses, no bruits; no JVD; no TM LUNGS: CTA bilaterally HEART: RRR (S1S2); grade 2/6 systolic murmur; crisp S1 click ABD: soft, NT; intact BS EXT: intact distal pulses; no edema SKIN: warm, dry MUSC: no obvious deformity NEURO: A/O (x3)     Impression & Recommendations:  Problem # 1:  COUMADIN THERAPY (ICD-V58.61) the patient was over anticoagulatedto her recent hospitalization which was placed on  antibiotics.  We will check a PT-INR today with further follow-up as per her primary care physician.  She is currently off antibiotics  Problem # 2:  ATRIAL FLUTTER, CHRONIC (ICD-427.32) the patient is in permanent atypical atrial flutter.  However she is rate controlled.  She reports no palpitations.  There are no plans for cardioversion her flutter is atypical and likely not particularly amenable to flutter ablation.  Of note is also the patient is a tricuspid valve replacement. The following medications were removed from the medication list:    Metoprolol Tartrate 50 Mg Tabs (Metoprolol tartrate) .Marland Kitchen... Take 1/2 tablet by mouth Her updated medication list for this  problem includes:    Metoprolol Succinate 50 Mg Xr24h-tab (Metoprolol succinate) .Marland Kitchen... Take 1 1/2 tabs (75mg ) daily    Warfarin Sodium 5 Mg Tabs (Warfarin sodium) ..... Use as directed by dr. Sherril Croon    Metoprolol Tartrate 25 Mg Tabs (Metoprolol tartrate) ..... May use up to two times a day as needed  Problem # 3:  PALPITATIONS (ICD-785.1) no recurrent palpitations. The following medications were removed from the medication list:    Metoprolol Tartrate 50 Mg Tabs (Metoprolol tartrate) .Marland Kitchen... Take 1/2 tablet by mouth Her updated medication list for this problem includes:    Metoprolol Succinate 50 Mg Xr24h-tab (Metoprolol succinate) .Marland Kitchen... Take 1 1/2 tabs (75mg ) daily    Warfarin Sodium 5 Mg Tabs (Warfarin sodium) ..... Use as directed by dr. Sherril Croon    Metoprolol Tartrate 25 Mg Tabs (Metoprolol tartrate) ..... May use up to two times a day as needed  Problem # 4:  MITRAL VALVE REPLACEMENT, HX OF (ICD-V15.1) mitral valve and tricuspid valve was assessed with a recent echocardiogram.  A St. Jude prosthesis is functioning normally.  She does however have a new finding of aortic stenosis.  her aortic stenosis is moderate.  This will require follow-up.Will order follow-up echocardiogram.  Problem # 5:  POSTURAL HYPOTENSION  (ICD-458.0) clinically the patient appears to have orthostatic symptoms.  We will check her blood pressures today.  I also asked her to decrease her Toprol ER to 75 those p.o. daily as her heart rate is noted running relatively slow.  Also her Lasix will be decreased to 20 milligrams p.o. daily.dishes severely orthostatic we will hold Lasix for several days.  Other Orders: 2-D Echocardiogram (2D Echo)  Patient Instructions: 1)  Decrease Metoprolol ER to 75mg  daily 2)  Decrease Lasix to 20mg  daily 3)  Echo  4)  Follow up with Dr. Sherril Croon regarding next coumadin check 5)  Follow up in  3 months Prescriptions: METOPROLOL SUCCINATE 50 MG XR24H-TAB (METOPROLOL SUCCINATE) take 1 1/2 tabs (75mg ) daily  #45 x 6   Entered by:   Hoover Brunette, LPN   Authorized by:   Lewayne Bunting, MD, [facility]   Signed by:   Hoover Brunette, LPN on 84/13/2440   Method used:   Electronically to        [facility] (retail)       [address]       [place], [place]  [address]       Ph: [phone]       Fax: [phone]   RxID:   [id number]

## 2010-07-18 NOTE — Progress Notes (Signed)
Summary: dizziness, SOB   Phone Note Other Incoming   Caller: aid for husband - Bonita Quin Summary of Call: Having some dizzines, shortness of breath.  Did have eph scheduled for 2/1, but going to reschedule to Monday, 1/31.  Blood cultures - no growth x 48 hours.  Scanned in.   Initial call taken by: Hoover Brunette, LPN,  July 06, 2010 1:19 PM  Follow-up for Phone Call        already seen in clinic Follow-up by: Lewayne Bunting, MD, [facility]  July 12, 2010 7:54 AM

## 2010-10-23 NOTE — Assessment & Plan Note (Signed)
[facility]                          [facility] NOTE   Elizabeth Wallace, Elizabeth Wallace                  MRN:          [id number]  DATE:12/08/2007                            DOB:          [birth year]    HISTORY OF PRESENT ILLNESS:  The patient is a 77 year old female with a  history of rheumatic heart disease status post mitral and tricuspid  valve replacement.  The patient also is in permanent atrial fibrillation  and she is on Coumadin therapy.  The patient is actually doing  remarkably well.  Her functional class has improved and she is in NYHA  class II B.  Scott saw her earlier in the year and ordered a Cardiolite  stress study which showed no definite ischemia.  Ejection fraction was  within normal limits.  By echo, the mitral and tricuspid valves are well  seated with normal function.   The patient states that she has no chest pain.  She has no orthopnea or  PND.  She does get fatigued easily but overall is less short of breath.  She has rare palpitations and no syncope.   MEDICATIONS:  1. Doxycycline for recent tick bite.  2. Toprol-XL 100 mg p.o. daily.  3. Coumadin as directed.  4. Glyburide.  5. Crestor 20 mg p.o. daily.  6. Lasix 40 mg p.o. daily, although the patient sometimes takes up to      2 tablets a day, pending her weight.  7. Xanax t.i.d.  8. Nexium.  9. Potassium supplements 400 mg p.o. daily.   PHYSICAL EXAMINATION:  VITAL SIGNS:  Blood pressure 111/69, heart rate  80, and weight 156 pounds.  NECK:  Normal carotid upstroke.  No carotid bruits.  LUNGS:  Clear breath sounds bilaterally.  HEART:  Regular rate and rhythm with normal S1 and S2 with normal  closing click on S1.  No pathological murmurs.  ABDOMEN:  Soft and nontender.  No rebound or guarding.  Good bowel  sounds.  EXTREMITIES:  No cyanosis, clubbing, or edema.  NEURO:  The patient is alert, oriented, and grossly nonfocal.   PROBLEM LIST:  1. Atrial  fibrillation/flutter, stable.  2. Chronic Coumadin therapy.  3. Rheumatic heart disease.      a.     Status post St. Jude mitral and tricuspid valve placement,       May 2002.      b.     Closure of patent foramen ovale following valve surgery.  4. Normal LV function.  5. History of congestive heart failure.  6. Normal coronary arteries by catheterization, March 2002.  7. Dyspnea with exertion, stable.  8. Dyslipidemia.  9. Diabetes mellitus.   PLAN:  1. The patient's EKG was reviewed in the office today and appears to      be stable.  She remains in atrial flutter with variable block.  2. At this point, I have no plans to cardiovert the patient as I think      the success rate is small and she likely will require      antiarrhythmic therapy.  I think given her functional  status which      is reasonably good, I think simplifying her medical therapy as much      as possible in order.  3. Continue Coumadin anticoagulation which is followed by Dr. Sherril Croon.  4. The patient will follow up with me in 6 months.     Learta Codding, MD,FACC  Electronically Signed    GED/MedQ  DD: 12/08/2007  DT: 12/09/2007  Job #: [id number]   cc:   Doreen Beam, MD

## 2010-10-23 NOTE — Assessment & Plan Note (Signed)
[facility]                          [facility]   NUBIA, ZIESMER                  MRN:          [id number]  DATE:06/12/2007                            DOB:          August 23, 1933    CARDIOLOGIST:  Dr. Lewayne Bunting.   PRIMARY CARE PHYSICIAN:  Dr. Doreen Beam.   REASON FOR VISIT:  One-month followup.   HISTORY OF PRESENT ILLNESS:  Ms. Agrawal is a 77 year old female patient  with a history of rheumatic heart disease status post mitral and  tricuspid valve replacement who has permanent atrial fibrillation who  returns the office day for followup.  Please see Dr. Margarita Mail note from  May 05, 2007 for complete details.  Briefly, the patient was noted  to be in atrial flutter at her last appointment.  She was also noted to  be volume overloaded and her Lasix was increased for a short period of  time.  She was to have a followup BMET and echocardiogram but those  tests were never done.   The patient notes today that she is having some shortness of breath.  This seems to be worse since we last saw her.  It is mainly with  exertion.  She describes NYHA class III to class IIIB symptoms.  She  also notes two-pillow orthopnea and questionable PND from time to time.  She notes occasional pedal edema that seems to be worse with standing.  She notes a cough that is fairly nonproductive.  She denies any  hemoptysis.  She does note occasional tachy palpitations.  She denies  syncope or near-syncope.  Denies any chest pain.   In reviewing her records, the patient does have a history of event  monitor performed in February 2008.  This revealed atrial  fibrillation/flutter.  Her atrial flutter is atypical.  Her cardiac  catheterization September 2002, prior to her surgery, revealed normal  coronary arteries.  A her mitral valve surgery was performed in 2002 by  Dr. Laneta Simmers.  At that time she had a St. Jude mitral valve and tricuspid  valve  replacement secondary to severe rheumatic mitral stenosis and  moderate tricuspid stenosis/regurgitation.  She also had a repair of a  patent foramen ovale.   CURRENT MEDICATIONS:  1. Toprol XL.  2. Coumadin as directed - followed by Dr. Sherril Croon.  3. Glyburide 2.5 mg daily.  4. Crestor 20 mg daily.  5. Lasix 40 mg daily.  6. Xanax p.r.n.  7. GlycoLax p.r.n.  8. Evista daily.  9. Nexium 40 mg daily.  10.Potassium 20 mEq daily.   PHYSICAL EXAM:  She is a well-nourished female in no acute distress.  Blood pressure 133/76, pulse 90, weight 161.8 pounds.  This was down  from 164 pounds in November.  HEENT is normal.  NECK:  Without JVD.  CARDIAC:  Mechanical S1, normal S2, regular rate and rhythm.  No  significant murmurs.  LUNGS:  Clear to auscultation bilaterally.  No wheezing, rhonchi or  rales.  No egophony.  ABDOMEN:  Soft, nontender.  EXTREMITIES:  Without edema.  Calves soft, nontender.  SKIN:  Warm and dry.  NEUROLOGIC:  She is alert and oriented x3.  Cranial nerves 2-12 are  grossly intact.   Oxygen saturation 97% room air.  Electrocardiogram reveals atypical  atrial flutter with variable AV block, heart rate 73, ST changes noted  in II, III, and aVF as well as V3-V6 similar to previous tracings -  possibly slightly worse on the current tracing.   IMPRESSION:  1. Atrial fibrillation/flutter.  2. Chronic Coumadin therapy.  3. Rheumatic heart disease.  Status post St. Jude mitral valve and      tricuspid valve replacement in May 2002.  Closure of patent foramen      ovale at the time of her valve surgery.  4. Good left ventricular function.  5. History of congestive heart failure.  6. Normal coronary arteries by catheterization March 2002.  7. Dyspnea with exertion.  8. Dyslipidemia.  9. Diabetes mellitus.   PLAN:  The patient presents to the office today for followup.  She  remains in atypical atrial flutter with variable AV block.  She notes  worsening dyspnea with  exertion with NYHA class III to class III B  symptoms.  After further review with Dr. Andee Lineman, we plan to:  1. Proceed with adenosine Cardiolite to rule out ischemic heart      disease as a cause of her symptoms.  It has been over 6 years since      her cardiac catheterization and she is a diabetic.  2. Proceed with 2-D echocardiogram to reassess her LV function and      valvular status.  This was to be done after her last appointment,      but was never completed.  3. Will check labs include BMET, BNP and a CBC given her dyspnea      exertion.  4. The patient prefers to be followed in our Coumadin Clinic and we      will arrange that.  5. The patient will also have a PA and lateral chest x-ray performed.  6. The patient will be brought back in followup in the next 3 to 4      weeks.  At that point, if her      Cardiolite is negative for ischemia, we will consider whether not      to proceed with cardioversion for atrial flutter.      Tereso Newcomer, PA-C  Electronically Signed      Learta Codding, MD,FACC  Electronically Signed   SW/MedQ  DD: 06/12/2007  DT: 06/12/2007  Job #: [id number]   cc:   Doreen Beam, MD

## 2010-10-23 NOTE — Assessment & Plan Note (Signed)
[facility]                          [facility]   Elizabeth, Wallace                  MRN:          [id number]  DATE:05/05/2007                            DOB:          [birth year]    REFERRING PHYSICIAN:  Dhruv Vyas   HISTORY OF PRESENT ILLNESS:  The patient is a white female with known  valvular heart disease status post mitral and tricuspid valve  replacement.  The patient has chronic atrial fibrillation.  She is on  chronic Coumadin therapy.  The patient, several months ago, underwent  hernia surgery.  She was bridged with anticoagulation at that time.  She  has been doing quite well.   The patient does not report any substernal chest pain, but she is  increasingly short of breath and has gained 13 pounds.  Interestingly,  also her EKG now demonstrates an atypical atrial flutter versus an  ectopic atrial tachycardia with block.   MEDICATIONS:  Listed in the chart.  1. Celebrex 100 mg a day.  2. Ultracet for her current pain.  3. Coumadin.  4. Glyburide.  5. Crestor.  6. Lasix 40 mg a day.  7. Xanax.  8. Nexium 40 mg p.o. daily.  9. K-Dur 20 mg p.o. daily.   PHYSICAL EXAMINATION:  VITAL SIGNS:  Blood pressure 120/68, heart rate  94, weight 164 pounds.  NECK:  Normal carotid upstroke.  No carotid bruits.  LUNGS:  Clear breath sounds bilaterally.  HEART:  Regular rate and rhythm with normal closing click of S1 and  normal S2.  No murmur, rub, or gallop.  No definite pathological  murmurs.  ABDOMEN:  Soft, but distended.  EXTREMITIES:  No cyanosis or clubbing.  There is 2+ peripheral pitting  edema.   EKG is atrial flutter versus ectopic atrial tachycardia with a heart  rate of 93 beats per minute.   PROBLEM LIST:  1. History of atrial fibrillation on chronic Coumadin therapy.  2. New onset atrial flutter versus ectopic atrial tachycardia.  3. Volume overload.  4. Rheumatic heart disease.      a.     Status  post St. Jude mitral valve replacement, tricuspid       valve replacement May of 2002.  5. Chronic Coumadin therapy secondary to atrial fibrillation with      valve replacement.  6. Angiograph with normal coronary arteries in March 2002.  7. Exertional dyspnea.  Possibly secondary to volume overload.  8. Chronic lower extremity edema.  9. Dyslipidemia.  10.Type 2 diabetes mellitus.   PLAN:  1. The patient appears to be volume overloaded.  I will increase her      Lasix to 40 mg p.o. b.i.d. x5 days and then cut back to 40 mg a      day.  2. The patient will have a BMET as well as an echographic study in 1      week's time.  3. Based on the studies results, further adjustments may be made in      her medical regimen.  4. The patient will also follow up with [place] in 1 month  and we will      reassess if she still has atrial flutter at that point in time, may      want to discuss further treatment with Dr. Graciela Husbands, although I do not      think that the patient is a candidate for radiofrequency ablation      given her tricuspid valve replacement.  Consideration could be      given to TEE-guided cardioversion.     Learta Codding, MD,FACC  Electronically Signed    GED/MedQ  DD: 05/05/2007  DT: 05/05/2007  Job #: [id number]   cc:   Doreen Beam

## 2010-10-23 NOTE — Assessment & Plan Note (Signed)
[facility]                          [facility]   LANYLA, COSTELLO                  MRN:          [id number]  DATE:12/01/2006                            DOB:          [birth year]    HISTORY OF PRESENT ILLNESS:  The patient is a white female with known  valve replacement, both mitral and tricuspid.  She has persistent atrial  fibrillation.  She is on chronic Coumadin therapy.  She has  angiographically normal coronary arteries.   The patient is referred by Dr. Gabriel Cirri for evaluation for preoperative  clearance prior to hernia repair.  The patient stated that occasionally  she has palpitations.  She also has atypical chest pain.  She takes  p.r.n. metoprolol on top of her Toprol-XL for heart rate control.  Her  EKG in the office today demonstrates atrial fibrillation with no acute  ischemic changes.   MEDICATIONS LISTED IN THE CHART:  1. Toprol-XL 100 mg a day.  2. Coumadin as directed.  3. Glyburide.  4. Crestor 20 mg p.o. daily.  5. Lasix 40 mg p.o. daily.  6. Xanax p.r.n.  7. Evista.  8. Nexium 40 mg p.o. daily.  9. K-Dur 20 mg p.o. daily.  10.Metoprolol p.r.n.   PHYSICAL EXAMINATION:  VITAL SIGNS:  Blood pressure 107/60, heart rate  86, weight 151 pounds.  NECK:  Normal carotid upstrokes.  No carotid bruits.  LUNGS:  Clear breath sounds bilaterally.  HEART:  Irregular rate and rhythm.  Normal closing click of S1, and  normal S2.  No pathological murmurs.  ABDOMEN:  Soft.  EXTREMITIES:  No cyanosis, clubbing, or edema.   PROBLEM LIST:  1. Persistent atrial fibrillation.  2. Normal left ventricular function.  3. Rheumatic heart disease.      a.     Status post mitral valve replacement and tricuspid valve       replacement in May 2002.  4. Chronic Coumadin therapy, followed by Dr. Sherril Croon.  5. Angiographically normal coronary arteries by catheterization in      March 2002.  6. Exertional dyspnea.  7. Chronic  lower extremity edema secondary to venous insufficiency.  8. Dyslipidemia.  9. Type 2 diabetes mellitus.   PLAN:  1. The patient is hemodynamically stable.  Chest pain is atypical.      Given the fact that she has normal coronary arteries, I do not      think that she needs a Cardiolite study prior to hernia surgery.  2. I placed a phone call to Dr. Gabriel Cirri that the patient can proceed      with hernia surgery.  She will need heart rate control with      continuation of her beta blocker therapy.  3. The patient will need to stop Coumadin several days prior to      surgery and then be transitioned to IV heparin.  Wait on Dr.      Gabriel Cirri as to the timing of the surgery in order to schedule this.  4. The patient also will need SBE prophylaxis, given her valvular      heart replacement.  Learta Codding, MD,FACC  Electronically Signed    GED/MedQ  DD: 12/01/2006  DT: 12/02/2006  Job #: [id number]   cc:   Lubertha Sayres, M.D.

## 2010-10-26 NOTE — Discharge Summary (Signed)
[place]. [facility]  Patient:    Elizabeth Wallace, Elizabeth Wallace                       MRN: [id number] Adm. Date:  [id number] Disc. Date: [id number] Attending:  Daisey Must Dictator:   Brita Romp, P.A.                           Discharge Summary  DISCHARGE DIAGNOSES: 1. Mitral stenosis. 2. Tricuspid regurgitation. 3. Chronic atrial fibrillation. 4. Non-insulin-dependent diabetes mellitus. 5. Degenerative joint disease.  HOSPITAL COURSE:  The patient was admitted to the hospital on March 18, for heparinization and precatheterization.  Coumadin had been previously discontinued.  The following day, the patient was seen by Dr. Graceann Congress.  The patient reported that she had been up all the previous night after her evening diuretic.  Dr. Corinda Gubler noted that by echocardiogram the patient has severe mitral stenosis and some moderate to severe tricuspid regurgitation as well as mild tricuspid stenosis.  She was scheduled for catheterization the following day.  On the following day, the patient was taken to the catheterization lab by Dr. Loraine Leriche Pulsipher.  Both left and right heart catheterization was performed. Hemodynamics: Right atrial pressure V20, mean 16.  Right ventricular pressure 48/12, pulmonary artery pressure 44/24.  Pulmonary capillary wedge pressure of V25 with mean 20.  Left ventricular pressure 88/10, aortic pressure 88/60. There is no aortic valve gradient noted on pull back.  By femoral dilution, cardiac output was noted to be 2.1 with a cardiac index of 1.2.  Dr. Gerri Spore felt that this was under estimated due to the significant tricuspid regurgitation.  By the Fick method, cardiac output was 5.2 with an index of 3.0.  He felt that this may have been an overestimation of cardiac output due to the patients metabolic rate.  The mean gradient across the mitral valve was noted to be 14 mmHg.  Mitral area was calculated to be 0.45 sq cm, this was done by  femoral dilution.  Mitral valve area calculated using the thick cardiac output was 1.12 sq cm.  Left ventriculogram revealed normal wall motion.  Ejection fraction was estimated to be greater than 60%.  There was no mitral regurgitation.  Coronary angiography: Left main was normal.  Left anterior descending was also free of angiographic disease.  Left circumflex system was also free of angiographic disease.  Right coronary artery was dominant and was free of angiographic disease.  Dr. Gerri Spore noted there was moderate pulmonary hypertension.  There was also severe mitral stenosis with no mitral regurgitation by angiography.  Dr. Gerri Spore felt that the patient either needed valvuloplasty or mitral valve replacement depending on the valve morphology.  The following day, the patient was seen by Dr. Olga Millers. She denied any chest pain or shortness of breath.  The patient was continued on heparin and restarted on Coumadin.  Plan was for the patient to follow up with Dr. Corinda Gubler in regards to the mitral stenosis.  It is noted that a copy of the transthoracic echocardiogram will need to be sent to [facility] to review if the patient will be a candidate for valvuloplasty.  If she is not, then elective mitral valve replacement will be necessary.  The following day, Dr. Jens Som saw the patient again.  Her blood pressure was noted to be 80/40.  He lowered her Toprol dose to 12.5 mg daily.  She was noted to  be neutropenic with white cell counts ranging between the mid 2s to 3.1.  The following day, Dr. Jens Som again noticed the patient was neutropenic. White count was 2.9 with INR of 1.4.  Dr. Dolan Amen, of [facility], was contacted.  He reviewed the patients lab work and with differential to be added.  He felt that it was appropriate that the patient be evaluated by him on an outpatient basis.  As a result, Dr. Jens Som felt thep patient was stable for discharge.  DISCHARGE  MEDICATIONS: 1. Coumadin 4 mg q.d. 2. Zyrtec 10 mg q.h.s. 3. Toprol XL 12.5 mg q.d. 4. KCl 20 mEq b.i.d. 5. Demodex 20 mg b.i.d. 6. Spironolactone 25 mg q.d. 7. Calcium supplement. 8. Nexium 40 mg q.d. 9. Glyburide 25 mg q.d.  ACTIVITY:  The patient was advised to return to her normal level of activity.  DIET:  Eat a low fat, low cholesterol, diabetic diet.  FOLLOWUP:  She is to follow up in the [facility] on Tuesday to have her INR checked.  She is to follow up with Dr. Nevada Crane as directed. She is to contact Dr. Jackey Loge office for follow-up appointment an set up evaluation for valvuloplasty at [facility].  LABORATORY DATA AND X-RAY FINDINGS:  White count 2.7, hemoglobin 12.7, hematocrit 37.1, RDW 15.4, platelets 117.  Sodium 138, potassium 3.4, chloride 98, CO2 32, BUN 30, creatinine 1.6, glucose 90.  Electrocardiogram revealed atrial fibrillation at a rate of approximately 60.  QRS was noted to be 0.084, QTC 0.394, axis 53.  There were also some nonspecific ST and T wave abnormalities.  Chest x-ray revealed cardiomegaly with findings compatible with mitral valve stenosis. DD:  08/30/00 TD:  09/01/00 Job: [id number]

## 2010-10-26 NOTE — Discharge Summary (Signed)
[place]. [facility]  Patient:    Elizabeth Wallace, Elizabeth Wallace                       MRN: [id number] Adm. Date:  [id number] Disc. Date: 11/02/00 Attending:  Cleatrice Burke Dictator:   Maple Mirza, P.A. CC:         Dr. Crissie Sickles, M.D. [facility]   Discharge Summary  DATE OF BIRTH:  [birth year]  PRIMARY CAREGIVER:   Dr. Colin Rhein, [place]  DISCHARGE DIAGNOSES: 1. Severe rheumatic mitral stenosis. 2. Moderate rheumatic tricuspid regurgitation/stenosis. 3. Moderate pulmonary hypertension. 4. Patent foramen ovale. 5. Klebsiella urinary tract infection on admission treated with Tequin for    four days. 6. Acute blood loss anemia intraoperatively requiring three units of packed    red blood cells, four units of fresh frozen plasma, one pack of platelets,    and a further one unit of packed red blood cells for anemia on    postoperative day #3. 7. Oral candidiasis, peroneal candidiasis secondary to antibiotics, treated    with Diflucan, Majic Mouthwash, Mycostatin cream.  SECONDARY DIAGNOSES: 1. Chronic atrial fibrillation on chronic Coumadin. 2. Status post mitral commissurotomy by way of left thoracotomy incision in    1971. 3. Congestive heart failure symptoms prior to this admission. 4. Type 2 diabetes mellitus. 5. Degenerative joint disease with recent vertebral compression fractures. 6. Gastroesophageal reflux disease. 7. History of left lower extremity deep venous thrombosis in 1962. 8. Bilateral lower extremity venous insufficiency/varicosities.  PROCEDURE:  Oct 23, 2000, mitral valve replacement with a 25 mm St. Jude mechanical valve, tricuspid valve replacement with a #33 mm St. Jude mechanical valve.   Closure of patent foramen ovale by Alleen Borne, M.D. The patient was maintained on IV dopamine for the first two days after surgery, but was transferred in stable and satisfactory condition to the intensive care  unit.  DISPOSITION:  Elizabeth Wallace is ready for discharge on postoperative day #10, May 26, after undergoing surgery here at [facility].  She was relieved of all supplemental oxygen by postoperative day #2 and did not experience any further pulmonary compromise.  She has been in a sinus rhythm throughout her postoperative convalescence without evidence of ectope.  She was started on Coumadin on postoperative day #1 and is achieving a goal INR of 3 to 3.5 on a dose of 5 mg daily.  HOSPITAL COURSE:  She was admitted with urinary tract infection and treated with Tequin.  A follow-up urinalysis was negative.  She was seen in consultation by dental surgeon, Cindra Eves, D.D.S., who repaired dental caries and did a periodontal treatment.  Had discussed perioperative prophylaxis for patients with indwelling mechanical valves.  Her oral candidiasis has largely resolved and she will go home on a course of Diflucan with Majic Mouthwash.  Her mental status has been clear in the postoperative period and she is ambulating independently.  Pain is controlled with Ultram.  DISCHARGE MEDICATIONS:  1. Ultram 50 mg one to two tablets p.o. q.4-6h. p.r.n. pain.  2. Diflucan 100 mg daily for seven days.  3. Toprol XL 25 mg daily.  4. Lasix 40 mg daily.  5. Potassium chloride 20 mEq daily.  6. Protonix 40 mg daily or she may use her Nexium 40 mg daily.  7. Gliburide 2.5 mg daily.  8. Acyclovir 5% ointment apply to lips twice daily.  9. Mycostatin 100,000 units per  gram apply to groins and perineum twice daily. 10. Majic Mouthwash one tablespoon swish and swallow three times daily. 11. Coumadin 5 mg daily or as directed by medical personnel.  FOLLOW-UP:  She will present to the [facility] for them to draw PT/INR on Tuesday, May 28.  The results will be faxed to the [facility] which is monitoring Elizabeth Wallace Coumadin levels in the outpatient  period.  ACTIVITY:  She is to ambulate as much as possible.  She is asked not to drive nor lift more than 10 pounds for the next six weeks.  DIET:  Low sodium, low cholesterol, ADA diet.  WOUND CARE:  She may shower daily and is to apply the Mycostatin cream to groins and perineum after showering.  She has an office visit with Dr. Corinda Gubler scheduled for two weeks after discharge.  This will be arranged and the appointment made for her and called to her.  She also has an office visit with Dr. Laneta Simmers three weeks after discharge and his office will call with that appointment.  BRIEF HISTORY:  Elizabeth Wallace is a 77 year old female with a long history of rheumatic mitral stenosis.  She underwent mitral commissurotomy through a left thoracotomy incision in 1971.  She has a history of chronic atrial fibrillation and is treated with chronic Coumadin therapy.  Prior to this admission she has a several month history of congestive heart failure symptoms and she underwent an echocardiogram on July 28, 2000, showing severe mitral stenosis and moderate tricuspid regurgitation.  In addition, there was mild aortic insufficiency.  Her ejection fraction has been preserved.  She underwent left heart catheterization in August 27, 2000.  The study demonstrated severe mitral stenosis with a calculated valve area of 0.45 sqcm. There was no significant mitral insufficiency.  There was a moderate pulmonary hypertension, no significant atherosclerotic coronary artery disease.  She has been referred to Alleen Borne, M.D. who saw her in consultation on Oct 10, 2000.  He discussed the risks and benefits of the procedure and allowed her ample time for decision.  She presents on May 13, for repair of her mitral and tricuspid stenoses.  HOSPITAL COURSE:  After presentation to [facility] on May 13, she was placed on IV heparin while her Coumadin anticoagulation was  lapsing.  She was seen  in dental consult by Dr. Kristin Bruins who instituted periodontal therapy and repaired dental caries.  He also discussed perioperative prophylaxis for patients with indwelling mechanical valves.  Her admission urinalysis showed Klebsiella urinary tract infection and she was treated with Tequin for this.  A follow-up urinalysis was negative.  On May 16, she underwent mitral valve replacement and tricuspid valve replacement. As mentioned above, she had intraoperative acute blood loss anemia which was replenished.  She was maintained on postoperative dopamine.  On postoperative day #1 she was started on Coumadin.  On postoperative day #2, she was achieving oxygen saturations greater than 95% on room air.  She was transfused with one unit of further packed red blood cells on postoperative day #3. She has been in a sinus rhythm throughout the postoperative course.  Her mental status has been clear.  Her pain is controlled with oral analgesia.  She did develop oral candidiasis and perineal candidiasis, started on a therapy of Diflucan, Majic Mouthwash, and Mycostatin Cream.  She will be seen in follow-up with Dr. Corinda Gubler and Dr. Laneta Simmers as mentioned above.  Her Coumadin is to be monitored by  the [facility].  Blood will be drawn at [facility]. DD:  11/01/00 TD:  11/03/00 Job: [id number]

## 2010-10-26 NOTE — Consult Note (Signed)
[place]. [facility]  Patient:    Elizabeth, Wallace                       MRN: [id number] Proc. Date: 10/21/00 Adm. Date:  [id number] Attending:  Cleatrice Burke CC:         Alleen Borne, M.D.  Cecil Cranker, M.D. [facility]  Dental Medicine   Consultation Report  DATE OF BIRTH:  [birth year]  REQUESTING PHYSICIAN:  Dr. Evelene Croon.  INTRODUCTION:  Elizabeth Wallace is a 77 year old white female referred by Dr. Evelene Croon for a dental consultation.  The patient was admitted for heparinization after chronic Coumadin therapy to prepare for an anticipated mitral valve replacement/tricuspid valve repair, heart surgery with Dr. Evelene Croon.  Dental consultation was requested for a pre-heart valve dental protocol consultation to rule out dental infection prior to the anticipated heart valve surgery.  PAST MEDICAL HISTORY:  1. Mitral stenosis.     a. Status post echocardiogram on July 28, 2000, which revealed severe        mitral stenosis, moderate tricuspid regurgitation, and mild aortic        insufficiency.  The ejection fraction was grossly normal.     b. Status post cardiac catheterization on August 27, 2000, which revealed        moderate pulmonary hypertension, severe mitral stenosis, and normal        coronary arteries.     c. Anticipated mitral valve replacement/tricuspid valve repair later in        the week by Dr. Laneta Simmers.  2. History of chronic atrial fibrillation, on chronic Coumadin therapy.  3. History of congestive heart failure.  4. Pulmonary hypertension.  5. History of deep vein thrombosis in 1962.  6. Non-insulin-dependent diabetes mellitus.  7. History of GERD.  8. Degenerative joint disease.  9. History of left kidney stone. 10. History of recent leukopenia/thrombocytopenia, currently being evaluated     by Dr. Nevada Crane. 11. History of bilateral venous insufficiency and varicose vein disease.  ALLERGIES:  CODEINE causes  NAUSEA.  MEDICATIONS:  1. Ambien 5 mg at bedtime as needed.  2. Heparin per protocol.  3. Spironolactone 25 mg daily.  4. Demadex 20 mg twice daily.  5. Toprol XL 25 mg daily.  6. Nexium 40 mg daily.  7. Glyburide 2.5 mg daily.  8. Coumadin on hold since Oct 19, 2000.  9. Potassium chloride 20 mEq twice daily. 10. Lactulose 30 mL at bedtime. 11. Zyrtec 10 mg at bedtime. 12. Regular Insulin per sliding scale.  SOCIAL HISTORY:  The patient is a nonsmoker and a nondrinker.  FAMILY HISTORY:  Noncontributory.  FUNCTIONAL ASSESSMENT:  The patient was independent for ADLs prior to this admission.  REVIEW OF SYSTEMS:  This is reviewed from the chart/health history assessment form this admission.  DENTAL HISTORY:  CHIEF COMPLAINT:  Dental consultation was requested for the evaluation of dentition prior to the heart valve surgery.  HISTORY OF PRESENT ILLNESS:  The patient with known severe mitral stenosis and tricuspid regurgitation.  The patient was hospitalized to convert the patient from chronic Coumadin therapy to heparin therapy.  The patient will then proceed with a mitral valve replacement and tricuspid valve repair with Dr. Evelene Croon later in the week.  Dental consultation was requested as part of a pre-heart valve dental protocol to rule out dental infection prior to the heart valve surgery.  The patient denies the presence of  toothaches, swellings, or abscesses.  The patient last saw her private dentist approximately one year ago.  The patient sees Dr. Laural Benes in [place], [place].  The patient last had one to two teeth pulled and two fillings done.  There were no complications.  The patient indicates her teeth were cleaned at that time as well.  The patient was due for a cleaning in January 2002, but did not go to that appointment.  The patient indicates that she has a lower partial denture at home which "fits pretty good," but the patient no longer wears this  very much.  DENTAL EXAM:  GENERAL:  The patient is a well-developed, well-nourished white female in no acute distress.  VITAL SIGNS:  Blood pressure is 90/56, pulse 60-65, temperature is 97.0, respirations are 20.  HEENT AND NECK:  There is no palpable lymphadenopathy.  There is left TMJ crepitus, but the patient denies acute TMJ symptoms.  There is no apparent soft-tissue pathology noted.  There are no abscesses noted.  PERIODONTAL:  Patient with chronic periodontitis with plaque and calculus accumulations, gingival recession, incipient tooth mobility, and moderate bone loss.  DENTITION:  The patient is missing multiple teeth:  Numbers 2, 3, 5, 16, 17, 18, 19, 20, 30, and 32.  DENTAL CARIES:  There are several dental caries/suboptimal restorations noted as per dental charting form.  The dental caries on tooth #10 appears to be very close to the pulp.  ENDODONTIC:  The patient denies acute symptoms at this time.  The dental caries on tooth #10 appears to be very close to the pulp contents and will need to be evaluated for possible periapical radiolucency with a full series of periapical radiographs.  CROWN OR BRIDGE:  There are no crown or bridge restorations.  The patient could be evaluated for future crown or bridge therapy by the private dentist of her choice.  PERATODYNIA:  The patient with a history of a lower ______ partial denture. The patient indicates that this was made one year ago.  The patient indicates that the partial "fits pretty good."  The patient does not have the lower partial denture with her, and I am unable to assess this clinically at this time.  OCCLUSION:  The patient has a poor occlusal scheme secondary to multiple missing teeth, supraeruption, and drifting of the unopposed teeth in the  edentulous areas and like replacement of all missing teeth with dental restorations.  RADIOGRAPHIC INTERPRETATION:  A Panorex x-ray was taken on Oct 20, 2000,  and was supplemented with a full series of dental radiographs on Oct 21, 2000, in the hospital dental clinic.  There are multiple missing teeth, numbers 2, 3, 5, 16, 17, 18, 19, 20, 30, and 32.  There is moderate horizontal and vertical bone loss.  There are multiple amalgam and resin restorations.  The resin restorations are radiolucent in nature and very hard to distinguish from dental caries.  There is supraeruption drifting of the unopposed teeth in the edentulous areas.  There are no apparent periapical radiolucencies.  The panoramic x-ray revealed an apparent radiolucent area involving the apices of tooth numbers 21 through 29.  This is not evident on the periapical radiographs taken on Oct 21, 2000.  ASSESSMENT:  1. Plaque and calculus accumulations/secretions.  2. Chronic periodontitis with moderate bone loss.  3. Generalized gingival recession.  4. Incipient tooth mobility.  5. Dental caries and suboptimal restorations, as per dental charting form.  6. No apparent periapical radiolucencies.  7. Multiple  missing teeth.  8. Supraeruption and driving of the unopposed teeth into the edentulous     areas.  9. History of lower partial denture which "fits pretty good" by patient     report, but for which I am unable to assess clinically due to the patient     having the denture at home. 10. Poor occlusal scheme secondary to multiple missing teeth, supraeruption     and drifting of the unopposed teeth, and lack of replacement of all     missing teeth with a dental restoration. 11. Left temporomandibular joint crepitus but without acute temporomandibular     joint symptoms.  Need for subacute bacterial endocarditis antibiotic     prophylaxis prior to invasive dental procedures. 12. Need for evaluation for adjustment of Coumadin/heparin if invasive dental     procedures/extractions are performed.  PLAN/RECOMMENDATIONS:  1. I discussed the risks, benefits, and complications of  various treatment     options with the patient in relationship to her medical and dental     conditions, anticipated heart valve surgery, and the risk for subacute     bacterial endocarditis.  We discussed extractions with alveoplasty,     periodontal therapy, dental restorations, root canal therapy, crown or     bridge therapy, replacement of missing teeth with dentures and implants,     and follow-up care with the private dentist of her choice.  The patient     wishes to proceed with excavation and restoration of tooth #10 at this     time, along with periodontal therapy.  The patient is aware that if the     cavity extends into the pulp it will either need to have a root canal or     be extracted.  The patient will then follow up for all other dental care     with the private dentist of her choice.  The regimen for subacute    bacterial endocarditis antibiotics will be discussed with the    cardiovascular surgeon as indicated. 2. Provision of written and verbal information on "heart valves and mouth    care" ______. 3. Discussion of findings with Dr. Evelene Croon and cardiologist as indicated.    Will determine the ability/stability of the patient to undergo dental    procedures at this time, along with the regimen of choice for the subacute    bacterial endocarditis antibiotic prophylaxis. 4. Will attempt to provide periodontal therapy and dental restoration today as    indicated. 5. Suggest continuation of the chlorhexidine rinse 0.12%, rinsing with 15 mL    twice daily.  This will be utilized pre- and postoperatively for    approximately one to two months. DD:  10/24/00 TD:  10/26/00 Job: [id number]

## 2010-10-26 NOTE — Op Note (Signed)
[place]. [facility]  Patient:    Elizabeth Wallace, Elizabeth Wallace                       MRN: [id number] Proc. Date: 10/23/00 Adm. Date:  [id number] Attending:  Cleatrice Burke CC:         Cecil Cranker, M.D. [facility]  Cardiac Catheterization Lab, [facility]  CVTS Office   Operative Report  PREOPERATIVE DIAGNOSIS:  Severe rheumatic mitral stenosis with moderate pulmonary hypertension, moderate tricuspid stenosis and regurgitation, patent foramen ovale.  POSTOPERATIVE DIAGNOSIS:  Severe rheumatic mitral stenosis with moderate pulmonary hypertension, moderate tricuspid stenosis and regurgitation, patent foramen ovale.  OPERATION:  Median sternotomy, extracorporeal circulation, mitral valve replacement using a 25 mm St. Jude valve, tricuspid valve replacement using a 33 mm St. Jude valve, repair of patent foramen ovale, and insertion of two permanent epicardial right ventricular pacing leads.  SURGEON:  Alleen Borne, M.D.  ASSISTANT:  Dominica Severin, P.A.-C.  ANESTHESIA:   General endotracheal anesthesia.  CLINICAL HISTORY:  This patient is a 77 year old woman with a long history of mitral stenosis who underwent a mitral commissurotomy through left thoracotomy incision in 1971.  She has a history of chronic atrial fibrillation.  She presented with a several-month history of congestive heart failure symptoms and underwent an echocardiogram on July 28, 2000, which showed severe mitral stenosis and moderate tricuspid regurgitation.  There was also moderate tricuspid stenosis and mild aortic insufficiency.  Left ventricular ejection fraction was grossly normal.  She subsequently underwent cardiac catheterization on August 27, 2000, which showed severe mitral stenosis with a calculated valve area of 0.45 cm squared.  There was no significant mitral insufficiency.  There was moderate pulmonary hypertension with a PA pressure of 44/24 and a wedge pressure of 20.  Left  ventricular ejection fraction was greater than 60%.  There was no significant coronary disease.  She has a history of chronic atrial fibrillation treated with Coumadin as well as non-insulin-dependent diabetes mellitus.  She is also noted to have a low platelet count and a low white blood cell count and has been followed by hematology for that.  After review of her echocardiogram and cardiac catheterization, it was felt that mitral valve replacement and tricuspid valve repair or replacement was the best treatment.  I discussed the operative procedure with her and her family including alternatives, benefits, and risks including bleeding, possible blood transfusion, infection, stroke, myocardial infarction, arrhythmias, possible need for a permanent pacemaker, and death.  They understood and agreed to proceed.  DESCRIPTION OF PROCEDURE:  The patient was taken to the operating room and placed on the table in the supine position.  After induction of general endotracheal anesthesia, a Foley catheter was placed in the bladder using sterile technique.  Then the chest, abdomen, and both lower extremities were prepped and draped in the usual sterile manner.  The chest was entered through a median sternotomy incision and the pericardial muscle in the midline.   The patient had dense pericardial adhesions completely obliterating the pericardial cavity which I suspect was probably related to her previous mitral commissurotomy which was performed through a left thoracotomy incision.  These adhesions were divided sharply to expose the right atrium and ascending aorta. Transesophageal echocardiogram was performed by anesthesiology.  The images were difficult at this point but showed markedly thickened and calcified mitral and tricuspid valves.  There was good left ventricular function.  Right ventricular function appeared well preserved.  Then, the patient was heparinized, and when adequate activated  clotting time was achieved, the distal ascending aorta was cannulated using a 20-French aortic cannula for arterial inflow.   Venous outflow was achieved using bicaval venous cannulation with a 24-French right angle venous cannula placed in the superior vena cava through a pursestring suture.  A 36-French plastic right angle cannula was inserted through the right atrium into the inferior vena cava.  An antegrade cardioplegia and vent cannula was inserted in the aortic root.  A retrograde cardioplegia cannula was inserted through the right atrium into the coronary sinus.  The patient was placed on cardiopulmonary bypass.  Further mobilization of the inferior and superior vena cavae were performed.  Then the aorta was crossclamped, and 500 cc of cold blood antegrade cardioplegia was administered in the aortic root with quick arrest of the heart.  Systemic hypothermia at 20 degrees C and topical hypothermia was used.   A temperature probe was placed in the septum.  Additional doses of retrograde cardioplegia were given throughout the case at approximately 20 to 30 minute intervals to maintain myocardial temperature less than 15 degrees C.  Then, the left atrium was opened through a vertical incision in the inner atrial groove.  The mitral valve retractor was placed.  Examination of the native valve was difficult due to the deep chest and more anterior location of the mitral valve, especially with the heart adhesed to the pericardium and the undersurface of the left side of the sternum.  With further mobilization and retraction, I was able to expose the valve.  The native mitral valve was severely stenotic due to rheumatic mitral valve disease.  There was marked thickening of the leaflets and fusion of the commissures.  There was a small central opening.  The subvalvular apparatus was markedly thickened and shortened and scarred together.  The papillary muscles and the remainder of  the  subvalvular apparatus was firmly adherent to the posterior ventricular wall.  The native valve was excised as sequential 2-0 Ethibond horizontal mattress sutures were placed around the annulus with the pledgets in the supra-annular position.  After the valve was completely excised and all the sutures placed, the annulus was sized, and a 25 mm St. Jude valve was chosen. A St. Jude valve was chosen due to the patients age of 77 years old.  I had discussed with the patient and her family the use of a mechanical valve, and they understood the alternatives, benefits, and risks including the need to be on Coumadin for the rest of her life which she has been on chronically for atrial fibrillation.  Then, the sutures were placed through the sewing ring and the valve lowered into place.  The sutures were tied sequentially.  The valve seated nicely, and there was no subvalvular obstruction.  Leaflets moved normally.  Then, the left atriotomy was closed in two layers using continuous 3-0 Prolene sutures with pledgets on both ends.  Then, attention was turned to the tricuspid valve.  The right atrium was opened through a vertical incision beginning in the atrial appendage.  The valve was exposed without difficulty.  Examination of the tricuspid valve showed severe rheumatic changes.  There was fusion of all three commissures. There was a fixed central opening for the valve.  There was marked thickening of the valve leaflets with some calcification of the free edges of the leaflets.  The leaflets were markedly shortened.  The subvalvular apparatus was also thickened and shortened.  I  did not feel that this valve was repairable.  Therefore, the native tricuspid valve was excised.  Part of the septal leaflet was left in place to allow placement of sutures through the edge of the leaflet instead of the annulus near the conduction system.  Then a series of pledgeted 2-0 Ethibond horizontal mattress sutures  were placed around the tricuspid annulus with the pledgets in a supra-annular position. As mentioned previously, the sutures in the area of the septal leaflet were placed through the free edge of the leaflet and not in the annulus.  Then the annulus was sized, and a 33 mm St. Jude mechanical valve was chosen.  The sutures were placed through the sewing ring and the valve lowered into place. The sutures were tied sequentially.   The valve seated nicely.  There was no valvular obstruction.  Then the patent foramen ovale was closed using a mattress suture of 4-0 Prolene pledgeted.  Then the right atriotomy was closed in two layers using continuous 4-0 Prolene suture pledgeted on both ends.  Then the patient was rewarmed to 37 degrees C.  The left side of the heart was de-aired.  The head was placed in the Trendelenburg position.  The crossclamp was removed with time of 214 minutes.  There was spontaneous return of junctional rhythm.  Then two temporary right ventricular and right atrial pacing wires were placed and brought through the skin.  With the patients history of chronic atrial fibrillation with a slow ventricular rate and now having mitral and tricuspid valve replacements, I thought that there was a significant risk of her developing complete heart block and bradycardia. Since it would be impossible to place a transvenous ventricular pacing lead, I thought placing epicardial ventricular leads was indicated.  I, therefore, placed two screw-in epicardial pacing leads into the anterior surface of the right ventricle.  These have Model Number 5071.  They were 35 cm leads.  One had Serial Number [id number]; the other had Serial Number [id number].  The lead ending in 8026 had a threshold of 0.5 volts to capture at 0.7 mA current. The impedance was 876.  The sensitivity was 7.2 mV.  The lead ending 80l27 had a threshold of 1.1 volts to capture at 1.2 mA current with an impedance of 1204 and  an R wave of 10.7 mV.  These leads were then tunnelled through the anterior abdominal wall fascia and brought out into a subcutaneous pocket developed in the right upper quadrant of the abdomen through a separate incision.  When the patient rewarmed to 37 degrees C, she was weaned from cardiopulmonary bypass on low-dose dopamine.  Total bypass time was 298 minutes.  Cardiac function appeared good with blood pressure of 110 to 130 systolic.  We no longer had a pulmonary artery catheter to measure pressures or cardiac output. Transesophageal echocardiogram was performed and showed normal functioning mechanical protheses.  There was no evidence of paravalvular leak.  Left and right ventricular function appeared well preserved.  There was trace aortic insufficiency which was noted preoperatively.  Then, protamine was given, and the venous, aortic, and cardioplegia cannulae were removed.  The patient was given 8 units of platelets due to platelet count of 50,000.  She was also given 2 units of fresh frozen plasma due to elevated INR on Coumadin preoperatively. This resulted in an adequate hemostasis.  Two chest tubes were placed with tube in the posterior pericardium and one in the anterior mediastinum.  The pericardium was loosely reapproximated over  the heart.  The sternum was closed with #6 stainless steel wires.  The fascia was closed using #1 Vicryl sutures. Subcutaneous tissue was closed using 2-0 Vicryl and the skin with 3-0 Vicryl subcuticular closure.  The small incision in the right upper quadrant for the permanent pacemaker leads was closed in a similar fashion.  The sponge, needle, and instrument counts were correct according to the scrub nurse.  Dry sterile dressing was applied over the incisions and around the chest tubes which were hooked to cardiovascular suction.  The patient remained hemodynamically stable and was transported to the SICU in guarded but stable condition. DD:   10/23/00 TD:  10/25/00 Job: [id number]

## 2010-10-26 NOTE — Discharge Summary (Signed)
[place]. [facility]  Patient:    Elizabeth Wallace, Elizabeth Wallace                       MRN: [id number] Adm. Date:  [id number] Disc. Date: [id number] Attending:  Cleatrice Burke Dictator:   Dominica Severin, P.A.                           Discharge Summary  ADDENDUM  DATE OF BIRTH:  1933-07-31  This patient was initially anticipated for discharge on Nov 03, 2000, although in the a.m. of Nov 03, 2000, her hemoglobin and hematocrit were decreased at 7.9 and 22, therefore the patient as given 1 unit of packed red blood cells and her discharge was held for one more day.  The patients repeat labs on the morning of Nov 04, 2000, showed increase of her hemoglobin and hematocrit at 10.0 and 28.5.  Nevertheless, her INR has decreased at 2.7, although the patient was given 5 mg of Coumadin again and she was sent home later on the afternoon of Nov 04, 2000 and to have her PT and INR drawn on Nov 05, 2000 with the results faxed to the [facility].  Her discharge medication changes include an addition of Niferex 150 mg daily as well as Colace 200 mg daily.  Otherwise her activity and followup is the same.  She is to see Dr. Laneta Simmers in three weeks for a chest x-ray and to follow up with Dr. Corinda Gubler in two weeks. D:  11/17/00 TD:  11/17/00 Job: [id number]

## 2010-10-26 NOTE — Discharge Summary (Signed)
[place]. [facility]  Patient:    Elizabeth Wallace, Elizabeth Wallace                       MRN: [id number] Adm. Date:  [id number] Disc. Date: [id number] Attending:  Daisey Must Dictator:   Brita Romp, P.A.-C. CC:         Dr. Pecola Lawless, [address] [place], [place] [address]  E. Graceann Congress, M.D. [facility]   Discharge Summary  ADDENDUM TO PREVIOUSLY DICTATED DISCHARGE SUMMARY [id number]. DD:  08/30/00 TD:  09/01/00 Job: [id number]

## 2010-10-26 NOTE — Cardiovascular Report (Signed)
[place]. [facility]  Patient:    Elizabeth Wallace, Elizabeth Wallace                       MRN: [id number] Proc. Date: 08/27/00 Adm. Date:  [id number] Attending:  Daisey Must CC:         Cecil Cranker, M.D. [facility]  Cardiac Catheterization Lab   Cardiac Catheterization  PROCEDURE:  Right and left heart catheterization with coronary angiography, left ventriculography.  CARDIOLOGIST:  Daisey Must, M.D. [facility]  INDICATION:  [name] is a 77 year old woman with a history of mitral stenosis.  She was referred for right and left heart catheterization to assess her hemodynamics, severity of mitral stenosis, and rule out coronary artery disease.  CATHETERIZATION PROCEDURAL NOTE:  An 8-French sheath was placed in the right femoral vein, a 6-French sheath in the right femoral artery.  Right heart catheterization was performed with a standard Swan-Ganz catheter.  Left heart catheterization was performed with standard Judkins 6-French catheters. Simultaneous measurements of the pulmonary capillary wedge pressure and left ventricular end-diastolic pressure were obtained to assess the mitral valve gradient.  Contrast used was Omnipaque.  There were no complications.  CATHETERIZATION RESULTS:  HEMODYNAMICS:  Right atrial pressure V20, mean 16.  Right ventricular pressure 48/12, pulmonary artery pressure 44/24.  Pulmonary capillary wedge pressure V25, mean 20.  Left ventricular pressure 88/10, aortic pressure 88/60.  There was no aortic valve gradient on pullback.  Cardiac output as measured by the thermodilution method is 2.1.  Cardiac index 1.2.  However, this is likely an underestimation due to the presence of significant tricuspid regurgitation. Cardiac output measured by the Fick method is 5.2 with a cardiac index of 3.0. This may be an overestimation of cardiac output due to the patients metabolic rate.  The mean gradient across the mitral valve determined by  simultaneous measurement of pulmonary capillary wedge pressure and left ventricular end-diastolic pressure was 14 mmHg.  Mitral valve area calculated using the cardiac output measured by thermodilution is 0.45 cm squared.  Mitral valve area calculated using the Fick cardiac output method was 1.12 cm squared.  LEFT VENTRICULOGRAM:  Reveals normal wall motion.  Ejection fraction estimated at greater than 60%.  There was no mitral regurgitation.  CORONARY ANGIOGRAPHY: (Right dominant).  Left main is normal.  Left anterior descending artery gives rise to a large bifurcating first diagonal and small second diagonal.  The left anterior descending artery is free of angiographic disease.  Left circumflex gives rise to a single, relatively small obtuse marginal branch.  The left circumflex is free of angiographic disease.  Right coronary artery gives rise to a large acute marginal branch which supplies a portion of the distal inferior septum.  The distal right coronary artery gives rise to a small posterior descending artery and two small posterolateral branches.  IMPRESSIONS: 1. Elevated right heart filling pressures and pulmonary capillary wedge    pressure with moderate pulmonary hypertension. 2. Normal left ventricular systolic function and normal left ventricular    end-diastolic pressures. 3. Severe mitral stenosis with no mitral regurgitation by angiography. 4. Angiographically normal coronary arteries. DD:  08/27/00 TD:  08/27/00 Job: [id number]

## 2010-10-26 NOTE — Consult Note (Signed)
[place]. [facility]  Patient:    Elizabeth Wallace, Elizabeth Wallace                       MRN: [id number] Proc. Date: 01/09/01 Adm. Date:  [id number] Attending:  Learta Codding                          Consultation Report  DATE OF BIRTH:  11-21-1933  REQUESTING PHYSICIAN:  Luis Abed, M.D.  REASON FOR EVALUATION:  Visual disturbance.  HISTORY OF PRESENT ILLNESS:  This is the initial inpatient consultation evaluation of this 77 year old woman with a past medical history including mitral and tricuspid valve replacements due to rheumatic heart disease in May 2002 followed by chronic anticoagulation who was admitted yesterday for chest pain and visual disturbance.  The patient reports that on Monday she had a fairly severe headache which was associated with some nausea and lasted all day.  These were reminiscent of "sick headaches" that she used to have going back to the time she was in school when she would have severe throbbing headaches associated with nausea that would last 2-3 days.  The headache resolved by Tuesday, then on Wednesday night she developed a pain in the side of her chest.  She took two Ultram for this, which she said were old, and went to bed.  The discomfort continued yesterday.  About 1 oclock in the afternoon yesterday, she said she was watching T.V. and she suddenly noticed that there appeared to be lines across the T.V. of various colors including blue, pink, and orange.  These included the entire visual field.  She reports that she never had any loss of vision.  She thinks later in the day her vision got a little bit blurry but the main problem has been the lines.  She has also noticed a color disturbance and that most of the things she looks at tend to have either a blue or a pink hue.  She also reports seeing some patterns including small squares and fortification phenomenon.  She developed a headache similar to the headache she had had on  Monday late Thursday, which went away a little bit this morning but at this time (around noon) has returned somewhat.  The visual disturbance has not gotten much better, although the lines are not as prominent.  She denies ever having anything like this before; however, on specific questioning she does say that she has seen "stars" before in the past.  She cannot really firmly relate this to any headache phenomenon.  She denies any associated difficulty with speech or swallowing or any numbness, weakness, pain, or paresthesias of the extremities with any of these episodes.  She was admitted to [facility] Cardiology service yesterday primarily over the concern of a possible pulmonary embolus and a subtherapeutic INR of 1.6.  PAST MEDICAL HISTORY: 1. History of rheumatic heart disease with severe mitral and tricuspid valve    disease.  She underwent replacement of these valves in May 2002. 2. History of paroxysmal atrial fibrillation since 1996.  She has been on    Coumadin since that time. 3. Diabetes x 2 years, which she reports is diet controlled. 4. Other medical problems include a history of CHF, history of DVT, history of    neutropenia, and DJD. 5. She says that she has had a progressive visual loss for five or six years    and  has been extensively worked up by eye doctors including Dr. Daphine Deutscher, a    neurophthalmologist at [facility].  No diagnosis has ever been made.  She    says that she is legally blind and was not able to get her drivers license    at her last renewal.  REVIEW OF SYSTEMS:  Per HPI.  She thinks she is a little more forgetful than she used to be.  She did have some nausea with the headaches.  MEDICATIONS PRIOR TO ADMISSION: 1. Coumadin. 2. Toprol. 3. Nexium. 4. Lasix. 5. Potassium. 6. Xanax. 7. Lactulose. 8. In the hospital, she is also on heparin and low-dose aspirin.  PHYSICAL EXAMINATION:  VITAL SIGNS:  Temperature 97.8, blood pressure 95/45, pulse 85,  respirations 20.  GENERAL:  She is alert and in no evident distress.  NECK:  Supple.  No carotid bruits.  CHEST:  Clear to auscultation.  HEART:  Regular rate and rhythm with audible clicks in the mitral and tricuspid areas which can be heard without the stethoscope.  NEUROLOGIC:  Mental status:  She is awake and alert and completely oriented. She is fluent and not dysarthric.  Mood is euthymic and affect appropriate. Cranial nerves:  Funduscopic exam is benign.  Pupils are equal and briskly reactive.  Visual acuity is 20/400 OU.  She is able to identify objects and name adequately but perceives colors poorly saying a red light is white and a yellow pen is gray.  Visual fields are full to confrontation.  Extraocular movements are normal without nystagmus.  Face, tongue, and palate move normally in the midline.  Hearing is intact to finger rub.  Motor exam: Normal bulk and tone.  Normal strength in all tested extremity muscles. Sensation:  Diminished vibratory sensation in the toes bilaterally.  Light touch and pinprick are intact.  Reflexes are 2+ and symmetric.  Toes are downgoing.  Finger-to-nose is performed well.  Heel-to-shin is performed well. Gait is normal.  She is able to heel-toe and tandem walk.  LABORATORY DATA:  INR on admission is 1.6.  CBC:  White cells 3.2, hemoglobin 12, platelets 167,000.  BMET is unremarkable.  Cardiac enzymes are negative. D-dimer is negative.  She has had no neurologic imaging.  IMPRESSION:  Visual disturbance.  The character of the visual disturbance is migrainous in nature rather than typical of a cerebrovascular event; however, it is possible that an embolic event into the posterior circulation touched off this phenomenon.  RECOMMENDATIONS: 1. We will check MRI/MRA of the brain.  I spoke with Dr. Constance Goltz and    Dr. Frazier Richards in radiology and they agree that her recent valvular surgery    did not represent a contraindication. 2. Agree with  heparin until therapeutic INR of 2.5-3.5. 3. Would treat headache symptomatically with Midrin. 4. We will follow with you.  Thank you for the consult.  DD:  01/09/01 TD:  01/10/01 Job: [id number]

## 2010-10-26 NOTE — Assessment & Plan Note (Signed)
[facility]                          [facility] OFFICE NOTE   Elizabeth, Wallace                  MRN:          [id number]  DATE:10/02/2006                            DOB:          [birth year]    REASON FOR VISIT:  Post hospital followup.   Elizabeth Wallace presents to the clinic for post hospital followup, after she  was seen by myself and Dr. Andee Lineman treated at [facility] in early  February, for evaluation of paroxysmal atrial fibrillation with a rapid  ventricular response.   Patient has a complex past medical history, as previously outlined, and  our recommendations were for her to continue on rate control with the  addition of Digoxin load.  We also advised that she be discharged on the  increased dose of Toprol at 100 daily, compared to the 25 daily with  which she initially presented.  We also set her up for a two-D  echocardiogram and CardioNet monitoring.   Two-D echo revealed normal left ventricular function (60-65%) with well-  seated mechanical mitral and tricuspid valve prosthesis with good  function.  There was also biatrial enlargement.   CardioNet monitoring was also done, and has been reviewed today,  revealing well-controlled atrial fibrillation/flutter with rates as low  as 49 BPM to as high as 154.   Clinically, the patient states that in general she has not had any of  the tachypalpitations with which she presented; however, yesterday  morning she did have her first bad recurrent episode, but this developed  before she actually took her scheduled dose of Toprol.  She then did  take the Toprol, but 15 minutes later took half a tablet of 25 mg  Digoxin.  Her symptoms finally subsided after approximately 30 minutes.   Regarding the Digoxin, patient states that she initially took this for  about 3-4 days after discharge, but then discontinued it secondary to  feeling chronically fatigued.  She states that her  symptoms resolved  after she stopped this medication.   Electrocardiogram today reveals atrial flutter at 4:1 block with a  ventricular rate of 80 BPM.   CURRENT MEDICATIONS:  1. Toprol-XL 100 daily.  2. Coumadin as directed.  3. Glyburide 2.5 daily.  4. Crestor 20 daily.  5. Lasix 40 daily.  6. Xanax 0.5 t.i.d. p.r.n.  7. Evista.  8. Nexium 40 daily.  9. K-Dur 20 daily.   PHYSICAL EXAMINATION:  VITAL SIGNS:  Blood pressure 132/76, pulse 80,  regular, weight 157.  GENERAL:  A 77 year old female sitting upright in no distress.  HEENT:  Normocephalic, atraumatic.  NECK:  Palpable bilateral carotid pulses with no bruits; no JVD.  LUNGS:  Clear to auscultation in all fields.  HEART:  Regular rate and rhythm (S1 and S2), a loud S1 click with no  significant murmur.  ABDOMEN:  Benign.  EXTREMITIES:  Trace pedal edema.  NEURO:  Very flat affect, but no focal deficits.   IMPRESSION:  1. Persistent atrial fibrillation/flutter.      a.     Adequate rate control by recent CardioNet monitoring.  2. Normal left ventricular function.  a.     By recent two-D echocardiogram.  3. Rheumatic heart disease.      a.     Status post St. Jude mitral valve replacement and tricuspid       valve replacement, May 2002.  4. History of paroxysmal atrial fibrillation.  5. Chronic Coumadin.      a.     Followed by Dr. Sherril Croon.  6. History of angiographically normal coronary arteries.      a.     Cardiac catheterization, March 2002.  7. Exertional dyspnea.  8. Chronic lower extremity edema.      a.     Secondary to venous insufficiency.  9. Hyperlipidemia.  10.Type 2 diabetes mellitus.   PLAN:  1. Continue current medication regimen.  However, the patient is      advised to take 25 mg of Lopressor p.r.n. for tachypalpitations      sustaining for more than 15-20 minutes.  She is to not take Digoxin      on a p.r.n. basis, as she did yesterday morning.  At this point in      time, therefore,  recommendations to continue weight control for her      atrial arrhythmia with no plans for more aggressive therapy with      Amiodarone.  We also discussed the possibility of proceeding with      radiofrequency ablation; however, patient has had documented atrial      fibrillation both recently, and in the past.  2. Schedule return clinic followup with myself and Dr. Andee Lineman in one      month, for further evaluation and recommendations.      Gene Serpe, PA-C       Learta Codding, MD,FACC    GS/MedQ  DD: 10/02/2006  DT: 10/02/2006  Job #: [id number]   cc:   [facility]                          [facility] NOTE   Elizabeth, Wallace                  MRN:          [id number]  DATE:10/02/2006                            DOB:          06-30-1933    REASON FOR VISIT:  Post hospital followup.   Elizabeth Wallace presents to the clinic for post hospital followup, after she  was seen by myself and Dr. Andee Lineman here at [facility] in early  February for evaluation of paroxysmal atrial fibrillation with rapid  ventricular response.   Patient has a complex past medical history, as previously outlined, and  our recommendations was for her to continue on rate control with the  addition of Digoxin load.  We also advised that she be discharged on the  increased dose of Toprol at 100 daily, compared to the 25 daily with  which she initially presented.  We also set her up for a 2-D  echocardiogram and CardioNet monitoring.   2-D echo revealed normal left ventricular function (60-65%) with well-  seated mechanical mitral and tricuspid valve prosthesis with good  function.  There was also biatrial enlargement.   CardioNet monitoring was also done, and has been reviewed today,  revealing well-controlled atrial fibrillation/flutter with rates as low  as 49 BPM to as high as 154.   Clinically, the patient states that in general she  has not had any of  the tachypalpitations with which she initially presented; however,  yesterday morning she did have her first bad recurrent episode, but this  developed before she actually took her scheduled dose of Toprol.  She  then did take the Toprol, but 15 minutes later also took half a tablet  of 25 mg Digoxin.  Her symptoms finally subsided after approximately 30  minutes.   Regarding the Digoxin, patient states that she initially took this for  about 3-4 days after discharge, but then discontinued it secondary to  feeling chronically fatigued.  She states that her symptoms resolved  after she stopped this medication.   Electrocardiogram today reveals atrial flutter at 4:1 block with a  ventricular rate of 80 BPM.   CURRENT MEDICATIONS:  1. Toprol-XL 100 daily.  2. Coumadin as directed.  3. Glyburide 2.5 daily.  4. Crestor 20 daily.  5. Lasix 40 daily.  6. Xanax 0.5 t.i.d. p.r.n.  7. Evista.  8. Nexium 40 daily.  9. K-Dur 20 daily.   PHYSICAL EXAMINATION:  VITAL SIGNS:  Blood pressure 132/76, pulse 80,  regular, weight 157.  GENERAL:  A 77 year old female sitting upright in no distress.

## 2010-10-26 NOTE — Op Note (Signed)
[place]. [facility]  Patient:    Elizabeth Wallace, Elizabeth Wallace                       MRN: [id number] Proc. Date: 10/23/00 Adm. Date:  [id number] Attending:  Cleatrice Burke CC:         Anesthesia Department   Operative Report  INDICATION FOR PROCEDURE:  Ms. Tang is a 77 year old female patient who presents today for mitral valve replacement and possible tricuspid valve replacement.  She is known to have significant valvular disease, history of congestive heart failure, easily short of breath, dyspnea on exertion, and significant mitral stenosis.  Our plan is to place a TEE probe intraoperatively for monitoring the cardiac function, its structures, as well as pre and post valvular repair replacement.  OPERATION:  Intraoperative transesophageal echocardiogram.  ANESTHESIOLOGIST:  Burna Forts, M.D.  PROCEDURE IN DETAIL:   The patient was brought to the holding area on the morning of surgery where, under local anesthesia and sedation, a pulmonary artery catheter is inserted.  Radial arterial line is inserted.  IVs are inserted.  She is then taken to the OR for routine induction of general anesthesia.  The trachea is intubated.  Following this, the TEE probe is lubricated and passed oropharyngeally to the stomach and then withdrawn for imaging of the cardiac structures.  Precardiopulmonary bypass TEE examination:  Left Ventricle:  Initially, the left ventricle in the short axis view is essentially obscured by the significant enlargement of the right ventricular chamber.  Of note is a very small left ventricular chamber and normal chamber wall size in the short axis view.  It appears to be satisfactory chamber in its function and form.  Count not visual a long axis view due to the large extent of the right ventricle which was quite huge and heavily trabeculated.  Mitral Valve:  The anterior leaflet could be seen well but posterior leaflet could not.  The  anterior leaflet was markedly thickened and fixed in its motion.  The limitation of motion was significant such that there was hardly any opening ability of the mitral valve at the coaptation point suggestive of significant mitral stenosis.  There was a small jet of regurgitant flow on the inflow aspect and trace jet of mitral regurgitant flow noted.  Left Atrium:  This is a significantly dilated left atrial chamber with a "smoke" appearance inside the chamber indicating sluggish blood flow.  No masses were noted.  The interatrial septum was intact.  Aortic Valve:  This is a somewhat enlarged aortic root in the cross section. There was significant aortic sclerosis, but overall, the valve apparatus did appear to open satisfactorily, and there was only trace aortic insufficiency flow noted.  Right Ventricle:  The right ventricle, as previously noted, was quite enlarged, heavily trabeculated.  The extent of the pulmonary artery also was quite enlarged, indicating some degree of pulmonary hypertension.  Tricuspid Valve: The tricuspid valve was markedly abnormal, thickened, redundant.  On Doppler examination, there was 3 to 4+ tricuspid regurgitant flow noted across this valve.  Right Atrium:  This was an enlarged right atrial chamber, but a portion of the Swan could be seen coiled within the chamber and had not in fact passed into the right ventricle.  The patient was placed on cardiopulmonary bypass.  Hypothermia was begun.  The decision was made by Dr. Laneta Simmers to replace both mitral and tricuspid.  The mitral was replaced initially with a bivalve  St. Jude apparatus. Additionally, the tricuspid valve was also excised and replaced with a St. Jude bivalvular apparatus.  Rewarming was carried out.  De-airing maneuvers were carried out, and the patient was separated from cardiopulmonary bypass with the initial attempt.  Post cardiopulmonary bypass TEE examination: (A little bit of  exam).  Mitral Valve:  In place of the diseased valvular apparatus now could be seen. the bileaflet mechanical valve with the St. Jude in the long axis view in the mitral valve position.  It appeared to be seated satisfactorily and working appropriately and that it opened satisfactorily during diastolic inflow, closed appropriately during systolic ejection.  On Doppler examination, there was just trace flame jets noted at the periphery of the valve and centrally as is noted with the St. Jude valvular apparatus.  It appeared to be functioning in an entirely appropriate manner.  Tricuspid Valve:  In place of the disease valve now could be seen another bivalvular apparatus, again opening appropriately and closing in the appropriate fashion.  It appeared to be functioning in a satisfactory manner and seated appropriately.  Left Ventricle:  The left ventricular chamber in the post bypass period in the short axis view appeared to be functioning and contractile in a satisfactory manner.  Overall, there was good contractility.  The rest of the cardiac examination was as previously described, and the patient was returned to the cardiac intensive care unit in stable condition. DD:  10/23/00 TD:  10/25/00 Job: [id number]

## 2010-10-26 NOTE — Discharge Summary (Signed)
[place]. [facility]  Patient:    Elizabeth Wallace, Elizabeth Wallace                         MRN: [id number] Adm. Date:  01/08/01 Disc. Date: 01/11/01 Attending:  Lewayne Bunting, M.D. Dictator:   Brita Romp, P.A.-C. CC:         Miguel Dibble, M.D.  Cecil Cranker, M.D. [facility]   Discharge Summary  DISCHARGE DIAGNOSES: 1. Chest pain, status post negative spiral computed tomographic scan for    pulmonary embolus. 2. History of St. Jude mitral valve replacement/tricuspid valve    replacement, subtherapeutic International Normalized Ratio. 3. Pulmonary hypertension. 4. New onset of visual disturbances.  HOSPITAL COURSE:  The patient is a pleasant 77 year old female, who had a St. Jude mitral valve and tricuspid valve replacement done in May of this year by Dr. Laneta Simmers.  This was done because of history of severe rheumatic mitral stenosis with moderate pulmonary hypertension.  Prior to surgery in March of 2002, she had a catheterization which revealed normal coronaries and a normal left ventricle with no wall motion abnormality.  Prior to admission, she developed some sharp left chest pain which was worse with inspiration and lying down.  The pain was constant since the night prior to admission.  She also reported some new onset of visual scotomata.  She had had a head CT done at [facility] which was negative.  She was seen and admitted by Dr. Olga Millers.  Dr. Jens Som noted that her initial enzymes were negative and that in light of her recent normal catheterization, he felt that the pain was most consistent with musculoskeletal pain.  However, the patient did have a history of deep vein thrombosis so we checked a D-dimer.  He also planned for a neurological consultation.  The next day, the patient was seen by Dr. Willa Rough.  She felt well and her chest pain still continued.  The patient related to Dr. Myrtis Ser that she is "seeing things again this morning."  She  described this as "puzzles with color."  Dr. Myrtis Ser noted that the patients D-dimer was elevated at 0.8.  As a result, he ordered a spiral CT of the chest to rule out pulmonary embolus.  He noted that the patient had already been started on heparin as a bridge until her INR became therapeutic.  Later that morning, the patient had Doppler ultrasounds of both lower extremities.  This showed no evidence of DVT, superficial thrombus or Bakers cyst bilaterally.  Later that afternoon, the patient had transthoracic echocardiogram performed.  Left ventricular size and function were normal.  The aortic valve thickness was mildly increased and there was noted to be some mild aortic regurgitation.  Both artificial valves were noted to be functioning well.  Right ventricle was mildly dilated.  Right atrial size was normal.  There was noted to be no pericardial effusion and no obvious source for emboli.  Subsequently, the patient was seen by neurology service.  They felt in light of the patients symptoms, a MRI/MRA of the brain was indicated.  They checked with radiology, who felt that her prosthetic valves were not a contraindication.  That evening, the patient had a spiral CT of the chest which was negative for pulmonary embolus.  An MRI of the brain with and without contrast showed some mild changes consistent with small vessel disease, some mild chronic ethmoid sinus disease.  There were no acute intracranial abnormalities and  views and images were negative for acute infarction.  MR angiography of the brain was negative for a significant proximal stenosis, intracranial aneurysm, or vascular malformation.  The next day, the patient was seen by Dr. Andee Lineman.  He noted that her INR was still subtherapeutic at 1.7.  As a result he increased her Coumadin dosing. Later that morning, the patient was seen by neurology.  Dr. Thad Ranger noted the normal MRA/MRI results.  He felt that the whole phenomenon was  probably migraine.  He recommended continuing Midrin; however, this had never been started and no dose is indicated.  The following day, the patient again was seen by Dr. Andee Lineman.  The patient reported no shortness of breath and occasional dizziness and no chest pain. He felt while he could not fully rule out an embolic event, the neurological work-up was negative.  He rechecked an INR which came back at 2.5 and as a result he felt that the patient was stable for discharge.  DISCHARGE MEDICATIONS: 1. Toprol XL 25 mg q.d. 2. Lasix 40 mg b.i.d. 3. K-Dur 20 mEq b.i.d. 4. Xanax 0.5 mg q.h.s. 5. Lactulose 15 mL q.h.s. 6. Enteric-coated aspirin 81 mg q.d. 7. Nexium as previously taken. 8. Coumadin 5 mg on odd numbered days, 7.5 mg or 1-1/2 pills on even days.  DISCHARGE INSTRUCTIONS: 1. The patient is advised to return to her normal level of activity. 2. She was instructed to follow a low fat, low salt diet. 3. She was instructed to have her Coumadin level checked at the [facility] on Wednesday, August 7.  A message was left for them,    and as well she was suggested to call them Monday to cross check. 4. She is to follow up with Dr. Corinda Gubler in September. 5. She is to follow up with Dr. Henry Russel as needed for scheduled.  LABORATORY VALUES:  Sodium 137, potassium 4.3, chloride 101, CO2 30, BUN 18, creatinine 1.2, glucose 184.  White count 3.0, high-grade 11.5, hematocrit 33.2, platelets 167.  As previously stated, a D-dimer was 0.80.  Electrocardiogram revealed normal sinus rhythm with a first-degree AV block. Nonspecific T wave changes.  P-R interval was 0.280, QRS was 0.090, Q-Tc 0.399, axis 35. DD:  01/11/01 TD:  01/13/01 Job: [id number]

## 2010-11-14 ENCOUNTER — Ambulatory Visit: Payer: Medicare Other | Admitting: Cardiology

## 2010-11-16 ENCOUNTER — Encounter: Payer: Self-pay | Admitting: Cardiology

## 2010-11-16 ENCOUNTER — Ambulatory Visit (INDEPENDENT_AMBULATORY_CARE_PROVIDER_SITE_OTHER): Payer: Medicare Other | Admitting: Cardiology

## 2010-11-16 ENCOUNTER — Encounter: Payer: Self-pay | Admitting: *Deleted

## 2010-11-16 VITALS — BP 106/67 | HR 79 | Ht 63.0 in | Wt 156.0 lb

## 2010-11-16 DIAGNOSIS — I4891 Unspecified atrial fibrillation: Secondary | ICD-10-CM

## 2010-11-16 DIAGNOSIS — I059 Rheumatic mitral valve disease, unspecified: Secondary | ICD-10-CM

## 2010-11-16 DIAGNOSIS — R0789 Other chest pain: Secondary | ICD-10-CM | POA: Insufficient documentation

## 2010-11-16 DIAGNOSIS — F3289 Other specified depressive episodes: Secondary | ICD-10-CM

## 2010-11-16 NOTE — Assessment & Plan Note (Signed)
The patient is in chronic atrial fibrillation but remains asymptomatic. She remains on Coumadin. She is compliant with her medical therapy and her INR is followed by primary care physician

## 2010-11-16 NOTE — Assessment & Plan Note (Addendum)
The patient has significant symptoms of depression. She reports bloating also consistent with irritable bowel syndrome. Dicyclomine seems to help. I told her to minimize the use of Ultram. Of also asked her to see her primary care physician to maybe consider an antidepressant.: Given instructions regarding FODMAP diet

## 2010-11-16 NOTE — Patient Instructions (Signed)
Continue all current medications. Your physician wants you to follow up in: 6 months.  You will receive a reminder letter in the mail one-two months in advance.  If you don't receive a letter, please call our office to schedule the follow up appointment   

## 2010-11-16 NOTE — Assessment & Plan Note (Signed)
Status post mitral valve replacement with St. Jude mitral prosthesis/mechanical bileaflet. Normal bowel function by exam. Also no evidence of abnormality of the bioprosthetic tricuspid valve.

## 2010-11-16 NOTE — Progress Notes (Signed)
HPI The patient is 77 year old female with a history of mechanical St. Jude mitral valve replacement, tricuspid valve replacement in May of 2002. She also had closure of a patent foramen ovale. She has normal LV function and normal coronary arteries by catheterization in March of 2002. Nuclear study was performed in 2011 which was within normal limits there was a small apical defect which was felt to be nonischemic. The patient after this however developed recurrent chest pain and we treated her medically. An echocardiogram showed normal LV function with an ejection fraction of 55-60% with moderate aortic stenosis but with normal function of the bileaflet mechanical prosthesis with a gradient of only 3 mm of mercury and a normal functioning tricuspid valve. The patient also has a history of chronic atrial fibrillation, as well as atrial flutter with rate control. In the past we have decided to proceed with cardioversion given the fact that she was brought asymptomatic with this rhythm. The patient was admitted several months ago with an episode of hypoglycemia. Her INR was also elevated. She ruled out for myocardial infarction and EKG showed no acute ischemic changes. Blood culture obtained to make sure the patient does not have endocarditis and there were reportedly within normal limits and the patient was discharged after an observation period. The patient complains of quite a bit of bloating. She also had recent bronchitis for several weeks with purulent secretions. The patient feels very lethargic has no energy and actually feels quite depressed. She also has chronic constipation which is bothering her quite a bit. She feels very comfortable in her abdomen but does have some improvement with dicyclomine. She complains of cramping pain. From a cardiac standpoint is doing well. She reports no shortness of breath orthopnea PND palpitations or syncope. She was told by primary care physician but has a large  kidney stone and she will be seeing the urologist.  Allergies  Allergen Reactions  . Oxycodone-Acetaminophen     REACTION: gi upset    No current outpatient prescriptions on file prior to visit.    No past medical history on file.  No past surgical history on file.  No family history on file.  History   Social History  . Marital Status: Married    Spouse Name: N/A    Number of Children: N/A  . Years of Education: N/A   Occupational History  . Not on file.   Social History Main Topics  . Smoking status: Never Smoker   . Smokeless tobacco: Never Used  . Alcohol Use: Not on file  . Drug Use: Not on file  . Sexually Active: Not on file   Other Topics Concern  . Not on file   Social History Narrative  . No narrative on file   [id number] positives as outlined above. The remainder of the 18  point review of systems is negative  PHYSICAL EXAM BP 106/67  Pulse 79  Ht 5\' 3"  (1.6 m)  Wt 156 lb (70.761 kg)  BMI 27.63 kg/m2  SpO2 99% General: Well-developed, well-nourished in no distress Head: Normocephalic and atraumatic Eyes:PERRLA/EOMI intact, conjunctiva and lids normal Ears: No deformity or lesions Mouth:normal dentition, normal posterior pharynx Neck: Supple, no JVD.  No masses, thyromegaly or abnormal cervical nodes Lungs: Normal breath sounds bilaterally without wheezing.  Normal percussion Cardiac: Irregular rate and rhythm with normal S1 and normal closing click off S2., no S3 or S4.  PMI is normal.  No pathological murmurs Abdomen: Normal bowel sounds, abdomen is soft  and nontender without masses, organomegaly or hernias noted.  No hepatosplenomegaly MSK: Back normal, normal gait muscle strength and tone normal Vascular: Pulse is normal in all 4 extremities Extremities: No peripheral pitting edema Neurologic: Alert and oriented x 3 Skin: Intact without lesions or rashes Lymphatics: No significant adenopathy Psychologic: Normal  affect  ECG:NA  ASSESSMENT AND PLAN

## 2010-11-19 ENCOUNTER — Encounter (HOSPITAL_COMMUNITY): Payer: Self-pay

## 2010-11-19 ENCOUNTER — Ambulatory Visit (HOSPITAL_COMMUNITY)
Admission: RE | Admit: 2010-11-19 | Discharge: 2010-11-19 | Disposition: A | Discharge status: Home / Self Care | Payer: Medicare Other | Source: Ambulatory Visit | Attending: Urology | Admitting: Urology

## 2010-11-19 ENCOUNTER — Other Ambulatory Visit (HOSPITAL_COMMUNITY): Payer: Self-pay | Admitting: Urology

## 2010-11-19 DIAGNOSIS — N200 Calculus of kidney: Secondary | ICD-10-CM | POA: Insufficient documentation

## 2010-11-19 DIAGNOSIS — R109 Unspecified abdominal pain: Secondary | ICD-10-CM | POA: Insufficient documentation

## 2010-11-21 ENCOUNTER — Ambulatory Visit: Payer: Medicare Other | Admitting: Cardiology

## 2010-12-24 ENCOUNTER — Other Ambulatory Visit: Payer: Self-pay | Admitting: Cardiology

## 2010-12-28 ENCOUNTER — Ambulatory Visit: Payer: Medicare Other | Admitting: Cardiology

## 2011-05-20 ENCOUNTER — Ambulatory Visit: Payer: Medicare Other | Admitting: Cardiology

## 2011-06-19 ENCOUNTER — Encounter: Payer: Self-pay | Admitting: Cardiology

## 2011-06-19 ENCOUNTER — Ambulatory Visit (INDEPENDENT_AMBULATORY_CARE_PROVIDER_SITE_OTHER): Payer: Medicare Other | Admitting: Cardiology

## 2011-06-19 VITALS — BP 118/60 | HR 68 | Ht 64.0 in | Wt 155.1 lb

## 2011-06-19 DIAGNOSIS — I350 Nonrheumatic aortic (valve) stenosis: Secondary | ICD-10-CM

## 2011-06-19 DIAGNOSIS — R42 Dizziness and giddiness: Secondary | ICD-10-CM

## 2011-06-19 DIAGNOSIS — I059 Rheumatic mitral valve disease, unspecified: Secondary | ICD-10-CM

## 2011-06-19 DIAGNOSIS — Z954 Presence of other heart-valve replacement: Secondary | ICD-10-CM

## 2011-06-19 DIAGNOSIS — I359 Nonrheumatic aortic valve disorder, unspecified: Secondary | ICD-10-CM

## 2011-06-19 MED ORDER — FUROSEMIDE 40 MG PO TABS: 20.0000 mg | ORAL_TABLET | Freq: Every day | ORAL | Status: DC

## 2011-06-19 NOTE — Patient Instructions (Signed)
   Hold Lasix today only, then resume at 20mg  daily Your physician wants you to follow up in: 6 months.  You will receive a reminder letter in the mail one-two months in advance.  If you don't receive a letter, please call our office to schedule the follow up appointment

## 2011-06-19 NOTE — Progress Notes (Signed)
Peyton Bottoms, MD, [facility] Board Certified in Adult Cardiovascular Medicine,Internal Medicine and Critical Care Medicine    CC: Follow patient with valve replacement including mitral and tricuspid valve  HPI:  The patient is a 78 year old female with a history of mechanical St. Jude mitral valve replacement, hyperacidity tricuspid valve replacement as well as closure of PFO in 2002. She has normal coronary arteries by catheterization in 2002. She also has normal LV function. More recently she was found to have moderate aortic stenosis. Nuclear study in 2011 showed no ischemia. From a cardiac standpoint has been stable. She reports no chest pain shortness of breath orthopnea or PND. She has been feeling somewhat unsteady and her blood pressure is only 84/54 today in the office. We did orthostatics but the patient is not particularly orthostatic. She does report symptoms of dizziness upon standing. She also reports that she is quite thirsty on most occasions.   PMH: reviewed and listed in Problem List in Electronic Records (and see below) Past Medical History  Diagnosis Date  . History of mitral valve replacement with mechanical valve      St. Jude's bileaflet prosthesis  . S/P tricuspid valve replacement      bioprosthetic valve  . Chronic atrial fibrillation   . S/P patent foramen ovale closure   . Chronic anticoagulation   . Nephrolithiasis   . Irritable bowel syndrome   . Atypical chest pain     Negative catheterization 2002, low risk Cardiolite study November 2011  . Diabetes mellitus   . Moderate aortic stenosis by prior echocardiogram      asymptomatic   No past surgical history on file.  Allergies/SH/FHX : available in Electronic Records for review  Allergies  Allergen Reactions  . Oxycodone-Acetaminophen     REACTION: gi upset   History   Social History  . Marital Status: Married    Spouse Name: N/A    Number of Children: N/A  . Years of Education: N/A    Occupational History  . Not on file.   Social History Main Topics  . Smoking status: Never Smoker   . Smokeless tobacco: Never Used  . Alcohol Use: Not on file  . Drug Use: Not on file  . Sexually Active: Not on file   Other Topics Concern  . Not on file   Social History Narrative  . No narrative on file   No family history on file.  Medications: Current Outpatient Prescriptions  Medication Sig Dispense Refill  . ALPRAZolam (XANAX) 0.5 MG tablet Take 0.5 mg by mouth at bedtime as needed.        . dicyclomine (BENTYL) 20 MG tablet Take 20 mg by mouth every 8 (eight) hours as needed. 30 minutes prior to meals       . esomeprazole (NEXIUM) 40 MG capsule Take 40 mg by mouth daily before breakfast.        . furosemide (LASIX) 40 MG tablet Take 0.5 tablets (20 mg total) by mouth daily.      Marland Kitchen glyBURIDE micronized (GLYNASE) 3 MG tablet Take 3 mg by mouth daily with breakfast.        . metoprolol (TOPROL-XL) 50 MG 24 hr tablet TAKE 1 & 1/2 TABLETS     (75mg ) BY MOUTH ONCEDAILY.  45 tablet  6  . polyethylene glycol (MIRALAX / GLYCOLAX) packet Take 17 g by mouth daily as needed.        . potassium chloride (KLOR-CON) 10 MEQ CR tablet  Take 10 mEq by mouth daily.        . raloxifene (EVISTA) 60 MG tablet Take 60 mg by mouth daily.        . simvastatin (ZOCOR) 40 MG tablet Take 40 mg by mouth at bedtime.        . sucralfate (CARAFATE) 1 GM/10ML suspension Take 1 g by mouth 3 (three) times daily as needed.        . traMADol-acetaminophen (ULTRACET) 37.5-325 MG per tablet Take 1 tablet by mouth every 6 (six) hours as needed.        . warfarin (COUMADIN) 5 MG tablet Take 5 mg by mouth daily. Per [place] office       . metoprolol tartrate (LOPRESSOR) 25 MG tablet Take 25 mg by mouth 2 (two) times daily as needed.          ROS: No nausea or vomiting. No fever or chills.No melena or hematochezia.No bleeding.No claudication  Physical Exam: BP 118/60  Pulse 68  Ht 5\' 4"  (1.626 m)  Wt 155 lb  1.9 oz (70.362 kg)  BMI 26.63 kg/m2 General: Well-nourished white female in no distress Neck: Normal carotid upstroke no carotid bruit. No thyromegaly nonnodular thyroid. The JVP is 5 cm Lungs: Clear breath sounds bilaterally. No wheezing Cardiac: Irregular rate and rhythm with normal closing click of S1. Normal S2. Systolic murmur at the right upper sternal border Vascular: No edema. Normal distal pulses Skin: Warm and dry Physcologic: Normal affect  12lead ECG: Atrial fibrillation with nonspecific ST-T wave changes Limited bedside ECHO:N/A   Patient Active Problem List  Diagnoses  . ANEMIA  . ATYPICAL DEPRESSIVE DISORDER  . MIGRAINE HEADACHE  . TRICUSPID VALVE DISORDER-status post bioprosthetic tricuspid valve replacement   . Atrial fibrillation chronic and rate controlled   . MEMORY LOSS  . PALPITATIONS  . SHORTNESS OF BREATH  . CHEST PAIN  . MITRAL VALVE REPLACEMENT, HX OF STATUS post St. Jude mechanical mitral valve replacement   . POSTURAL HYPOTENSION -resting hypotension   . WEAKNESS  . Atypical chest pain-negative Cardiolite study 2011   . Moderate aortic stenosis by prior echocardiogram    PLAN   The patient's blood pressures relatively low. She's not volume overloaded. If anything she may be somewhat volume depleted although she's not frankly orthostatic. Will hold Lasix today and decrease Lasix to 20 mg by mouth daily  Followup echo in 6 months to monitor her moderate aortic stenosis. Currently asymptomatic.  Currently on warfarin therapy with no complications.  Followup patient in 6 months.

## 2011-07-26 ENCOUNTER — Other Ambulatory Visit: Payer: Self-pay | Admitting: *Deleted

## 2011-07-26 MED ORDER — METOPROLOL SUCCINATE ER 50 MG PO TB24: ORAL_TABLET | ORAL | Status: DC

## 2011-07-29 ENCOUNTER — Other Ambulatory Visit: Payer: Self-pay

## 2011-08-06 ENCOUNTER — Telehealth: Payer: Self-pay | Admitting: *Deleted

## 2011-08-06 NOTE — Telephone Encounter (Signed)
Daughter is concerned that something may be going on with patient since she still has sob, fatigue, laying around and occasional chest pain. Daughter requesting an appointment to come in and speak with MD about mother. Nurse gave appointment for March 28th.

## 2011-08-07 NOTE — Telephone Encounter (Signed)
Daughter informed

## 2011-08-07 NOTE — Telephone Encounter (Signed)
Her daughter can call me on my cell phone. The [phone]

## 2011-09-05 ENCOUNTER — Telehealth: Payer: Self-pay | Admitting: *Deleted

## 2011-09-05 ENCOUNTER — Encounter: Payer: Self-pay | Admitting: Cardiology

## 2011-09-05 ENCOUNTER — Encounter: Payer: Self-pay | Admitting: *Deleted

## 2011-09-05 ENCOUNTER — Ambulatory Visit (INDEPENDENT_AMBULATORY_CARE_PROVIDER_SITE_OTHER): Payer: Medicare Other | Admitting: Cardiology

## 2011-09-05 VITALS — BP 102/63 | HR 71 | Ht 64.0 in | Wt 151.0 lb

## 2011-09-05 DIAGNOSIS — F32A Depression, unspecified: Secondary | ICD-10-CM

## 2011-09-05 DIAGNOSIS — I482 Chronic atrial fibrillation, unspecified: Secondary | ICD-10-CM

## 2011-09-05 DIAGNOSIS — Z9889 Other specified postprocedural states: Secondary | ICD-10-CM

## 2011-09-05 DIAGNOSIS — F329 Major depressive disorder, single episode, unspecified: Secondary | ICD-10-CM

## 2011-09-05 DIAGNOSIS — I059 Rheumatic mitral valve disease, unspecified: Secondary | ICD-10-CM

## 2011-09-05 DIAGNOSIS — M353 Polymyalgia rheumatica: Secondary | ICD-10-CM

## 2011-09-05 DIAGNOSIS — R0602 Shortness of breath: Secondary | ICD-10-CM

## 2011-09-05 DIAGNOSIS — Z954 Presence of other heart-valve replacement: Secondary | ICD-10-CM

## 2011-09-05 DIAGNOSIS — R0789 Other chest pain: Secondary | ICD-10-CM

## 2011-09-05 DIAGNOSIS — Z8774 Personal history of (corrected) congenital malformations of heart and circulatory system: Secondary | ICD-10-CM

## 2011-09-05 DIAGNOSIS — D649 Anemia, unspecified: Secondary | ICD-10-CM

## 2011-09-05 DIAGNOSIS — I359 Nonrheumatic aortic valve disorder, unspecified: Secondary | ICD-10-CM

## 2011-09-05 DIAGNOSIS — I519 Heart disease, unspecified: Secondary | ICD-10-CM

## 2011-09-05 DIAGNOSIS — I951 Orthostatic hypotension: Secondary | ICD-10-CM

## 2011-09-05 DIAGNOSIS — Z7901 Long term (current) use of anticoagulants: Secondary | ICD-10-CM

## 2011-09-05 DIAGNOSIS — I4891 Unspecified atrial fibrillation: Secondary | ICD-10-CM

## 2011-09-05 DIAGNOSIS — Z952 Presence of prosthetic heart valve: Secondary | ICD-10-CM

## 2011-09-05 DIAGNOSIS — R002 Palpitations: Secondary | ICD-10-CM

## 2011-09-05 NOTE — Telephone Encounter (Signed)
No precert required.  Medicare, Medicaid °

## 2011-09-05 NOTE — Telephone Encounter (Signed)
Pre-cert TEE at [facility] 4/4 w/De Natasha Bence

## 2011-09-05 NOTE — Patient Instructions (Signed)
Follow up in 3 months. Your physician recommends that you continue on your current medications as directed. Please refer to the Current Medication list given to you today. Your physician recommends that you go to the [facility] for lab work: CBC/CMET/TSH/SED RATE Your physician has recommended that you wear a holter monitor. Holter monitors are medical devices that record the heart's electrical activity. Doctors most often use these monitors to diagnose arrhythmias. Arrhythmias are problems with the speed or rhythm of the heartbeat. The monitor is a small, portable device. You can wear one while you do your normal daily activities. This is usually used to diagnose what is causing palpitations/syncope (passing out).  Your physician has requested that you have a TEE. During a TEE, sound waves are used to create images of your heart. It provides your doctor with information about the size and shape of your heart and how well your heart's chambers and valves are working. In this test, a transducer is attached to the end of a flexible tube that's guided down your throat and into your esophagus (the tube leading from you mouth to your stomach) to get a more detailed image of your heart. You are not awake for the procedure. Please see the instruction sheet given to you today. For further information please visit [email or web address] Enroll in our Coumadin Clinic with Misty Stanley.

## 2011-09-07 ENCOUNTER — Encounter: Payer: Self-pay | Admitting: Cardiology

## 2011-09-07 DIAGNOSIS — M353 Polymyalgia rheumatica: Secondary | ICD-10-CM | POA: Insufficient documentation

## 2011-09-07 DIAGNOSIS — F329 Major depressive disorder, single episode, unspecified: Secondary | ICD-10-CM | POA: Insufficient documentation

## 2011-09-07 DIAGNOSIS — I482 Chronic atrial fibrillation, unspecified: Secondary | ICD-10-CM | POA: Insufficient documentation

## 2011-09-07 DIAGNOSIS — Z7901 Long term (current) use of anticoagulants: Secondary | ICD-10-CM | POA: Insufficient documentation

## 2011-09-07 DIAGNOSIS — Z952 Presence of prosthetic heart valve: Secondary | ICD-10-CM | POA: Insufficient documentation

## 2011-09-07 DIAGNOSIS — F32A Depression, unspecified: Secondary | ICD-10-CM | POA: Insufficient documentation

## 2011-09-07 DIAGNOSIS — Z8774 Personal history of (corrected) congenital malformations of heart and circulatory system: Secondary | ICD-10-CM | POA: Insufficient documentation

## 2011-09-07 DIAGNOSIS — Z954 Presence of other heart-valve replacement: Secondary | ICD-10-CM | POA: Insufficient documentation

## 2011-09-07 NOTE — Progress Notes (Signed)
Elizabeth Bottoms, MD, [facility]IM Board Certified in Adult Cardiovascular Medicine,Internal Medicine and Critical Care Medicine    RU:[id number] patient with mitral and tricuspid valve replacement complaining of shortness of breath, weakness and fatigue  HPI:   The patient is a 78 year old female with multiple valvular heart disease, status post mitral and tricuspid valve replacement and also native moderate aortic stenosis.  Her symptom complex consistent of increased shortness of breath, fatigue with no energy, occasional palpitations.  The patient's daughter is with her and she said that she feels that her mother is tired all day long.  She has no energy.  Sometimes she lays for days in bed.  She reports some atypical aching on the left side of her chest.  She feels also that both arms were stiff and tight and hurting particularly when she is sleeping.she doesn't feel like cooking anymore.  At times she gets very irritable because she feels tired all the time.  She also has been struggling with old viral illness over the last several couple of weeks.  She had a recent echocardiogram done by primary care physician with details as outlined below and was referred for further evaluation.  Of note is also that we reviewed her Coumadin levels and they have been subtherapeutic for an artificial valve in the mitral position.she also been taken off Coumadin at least one time for 4 days prior to dental extraction without any bridging.  She reports no orthopnea or PND She does not report any presyncope or syncope but as outlined above she does report significant fatigue and weakness.by echocardiogram she also appears to have developed new LV systolic dysfunction with an ejection fraction of 40-45%  PMH: reviewed and listed in Problem List in Electronic Records (and see below) Past Medical History  Diagnosis Date  . History of mitral valve replacement with mechanical valve      St. Jude's bileaflet prosthesis  .  S/P tricuspid valve replacement      bioprosthetic valve  . Chronic atrial fibrillation     currently normal sinus rhythm  . S/P patent foramen ovale closure   . Chronic anticoagulation   . Nephrolithiasis   . Irritable bowel syndrome     constipation  . Atypical chest pain     Negative catheterization 2002, low risk Cardiolite study November 2011  . Diabetes mellitus   . Moderate aortic stenosis by prior echocardiogram      asymptomatic  . Depression   . LV dysfunction     echocardiogram March 2013 by her primary care physician ejection fraction 40-45%, moderate aortic stenosis, prosthetic valves poorly reported , small pericardial effusion.   No past surgical history on file.  Allergies/SH/FHX : available in Electronic Records for review  Allergies  Allergen Reactions  . Oxycodone-Acetaminophen     REACTION: gi upset   History   Social History  . Marital Status: Married    Spouse Name: N/A    Number of Children: N/A  . Years of Education: N/A   Occupational History  . Not on file.   Social History Main Topics  . Smoking status: Never Smoker   . Smokeless tobacco: Never Used  . Alcohol Use: Not on file  . Drug Use: Not on file  . Sexually Active: Not on file   Other Topics Concern  . Not on file   Social History Narrative  . No narrative on file   No family history on file.  Medications: Current Outpatient Prescriptions  Medication Sig Dispense Refill  . ALPRAZolam (XANAX) 0.5 MG tablet Take 0.5 mg by mouth at bedtime as needed.        . dicyclomine (BENTYL) 20 MG tablet Take 20 mg by mouth every 8 (eight) hours as needed. 30 minutes prior to meals       . esomeprazole (NEXIUM) 40 MG capsule Take 40 mg by mouth daily before breakfast.        . furosemide (LASIX) 40 MG tablet Take 0.5 tablets (20 mg total) by mouth daily.      Marland Kitchen glyBURIDE micronized (GLYNASE) 3 MG tablet Take 3 mg by mouth daily with breakfast.        . metoprolol succinate (TOPROL-XL) 50  MG 24 hr tablet Take one and one half tablet by mouth once daily with or immediately following a meal.  45 tablet  6  . metoprolol tartrate (LOPRESSOR) 25 MG tablet Take 25 mg by mouth 2 (two) times daily as needed.        . polyethylene glycol (MIRALAX / GLYCOLAX) packet Take 17 g by mouth daily as needed.        . potassium chloride (KLOR-CON) 10 MEQ CR tablet Take 10 mEq by mouth daily.        . raloxifene (EVISTA) 60 MG tablet Take 60 mg by mouth daily.        . simvastatin (ZOCOR) 40 MG tablet Take 40 mg by mouth at bedtime.        . sucralfate (CARAFATE) 1 GM/10ML suspension Take 1 g by mouth 3 (three) times daily as needed.        . traMADol-acetaminophen (ULTRACET) 37.5-325 MG per tablet Take 1 tablet by mouth every 6 (six) hours as needed.        . warfarin (COUMADIN) 5 MG tablet Take 5 mg by mouth daily. Per [facility]         ROS: No nausea or vomiting. No fever or chills.No melena or hematochezia.No bleeding.No claudication.  Fatigue and cessation of depression.  Decreased energy levels.  Physical Exam: BP 102/63  Pulse 71  Ht 5\' 4"  (1.626 m)  Wt 151 lb (68.493 kg)  BMI 25.92 kg/m2  SpO2 97% General:well-nourished white female complaining of generalized weakness and pale appearing Neck:normal carotid upstroke with no carotid bruits.  No thyromegaly non-nodular thyroid.  JVP 6-7 cm Lungs:clear breath sounds bilaterally.  No wheezing Cardiac:regular rate and rhythm with normal closing click of S1 and S2.  No murmur rubs or gallops Vascular:no edema.  Normal distal pulses. Skin:pale but warm and dry Physcologic:depressed affect  12lead [id number] telemetry strip shows normal sinus rhythm Limited bedside ECHO:N/A No images are attached to the encounter.   Assessment and Plan  POSTURAL HYPOTENSION No recurrent symptoms of orthostatic hypotension.  LV dysfunction Recent echocardiogram I primary care physician demonstrates that there is worsening of her LV function,  in conjunction with her known moderate aortic stenosis.  The valves are poorly evaluated.  The patient does complain of increased shortness of breath and further evaluation with transesophageal echocardiogram seems reasonable.  She is status post St. Jude mitral valve replacement and bioprosthetic tricuspid valve replacement in 2002. Risks of the Procedure Possible risks associated with a transesophageal echocardiogram include, but are not limited to, the following:   breathing problems   heart rhythm problems   infection of the heart valves   bleeding of the esophagus Patients with known problems of the esophagus, such as esophageal varices, esophageal obstruction, or  radiation therapy to the area of the esophagus should be evaluated carefully by the physician before having the procedure. Patients who are allergic to or sensitive to medications or latex should notify their physician. If you are pregnant or suspect that you may be pregnant, you should notify your physician. There may be other risks depending upon your specific medical condition. Be sure to discuss any concerns with your physician prior to the procedure.   Chronic atrial fibrillation Patient remains on anticoagulation but I have concerns about adequacy of her anticoagulation.  She also told me that when she had dental work done her Coumadin was stopped 4 days prior to dental work without any bridging in the setting of a mitral valve replacement.  Fortunately today on physical examination she appears to have a normal click off S1,however her increased symptoms of shortness of breath are concerning and she will need further evaluation of her mechanical valves.  It is possible that her shortness of breath is due to worsening LV dysfunction and a further ischemia workup also may be needed.  On the other hand she has several symptoms that are nonspecific in particularfatigue and shortness of breath and a sensation decreased exercise  tolerancewhich all could point towards depression  SHORTNESS OF BREATH We'll proceed with further evaluation as outlined above.  No clinical evidence of heart failure however  Encounter for long-term (current) use of anticoagulants Anticoagulation provided by her primary care physician.  Goal INR is 2.5-3.5  Depression My initial gestalt is that this patient has severe depression.  We will proceed with examination of the cardiac structures with a TEE and possible ischemia workup is within normal limits this particular problem will need to be urgently addressed.  Atypical chest pain Patient does not report specific symptoms off chest pain during this admission.  She had a negative cardiac catheterization in 2002 and had a low risk Cardiolite study in November of 2011.  ANEMIA This could be exhibiting to her weakness and we will obtain blood work including CBC, BMET TSH.  Polymyalgia rheumatica Patient reports symptoms of weakness in hips and both arms and obtained a sedimentation rate.  She can further follow up with her primary care physician also in the event of sedimentation rate is elevated.    Patient Active Problem List  Diagnoses  . ANEMIA  . ATYPICAL DEPRESSIVE DISORDER  . MIGRAINE HEADACHE  . MEMORY LOSS  . PALPITATIONS  . SHORTNESS OF BREATH  . CHEST PAIN  . POSTURAL HYPOTENSION  . WEAKNESS  . Atypical chest pain  . Moderate aortic stenosis by prior echocardiogram  . Encounter for long-term (current) use of anticoagulants  . LV dysfunction  . Depression  . S/P patent foramen ovale closure  . Chronic atrial fibrillation  . S/P tricuspid valve replacement  . History of mitral valve replacement with mechanical valve  . Polymyalgia rheumatica

## 2011-09-07 NOTE — Assessment & Plan Note (Signed)
Patient remains on anticoagulation but I have concerns about adequacy of her anticoagulation.  She also told me that when she had dental work done her Coumadin was stopped 4 days prior to dental work without any bridging in the setting of a mitral valve replacement.  Fortunately today on physical examination she appears to have a normal click off S1,however her increased symptoms of shortness of breath are concerning and she will need further evaluation of her mechanical valves.  It is possible that her shortness of breath is due to worsening LV dysfunction and a further ischemia workup also may be needed.  On the other hand she has several symptoms that are nonspecific in particularfatigue and shortness of breath and a sensation decreased exercise tolerancewhich all could point towards depression

## 2011-09-07 NOTE — Assessment & Plan Note (Signed)
This could be exhibiting to her weakness and we will obtain blood work including CBC, BMET TSH.

## 2011-09-07 NOTE — Assessment & Plan Note (Addendum)
Recent echocardiogram I primary care physician demonstrates that there is worsening of her LV function, in conjunction with her known moderate aortic stenosis.  The valves are poorly evaluated.  The patient does complain of increased shortness of breath and further evaluation with transesophageal echocardiogram seems reasonable.  She is status post St. Jude mitral valve replacement and bioprosthetic tricuspid valve replacement in 2002. Risks of the Procedure Possible risks associated with a transesophageal echocardiogram include, but are not limited to, the following:   breathing problems   heart rhythm problems   infection of the heart valves   bleeding of the esophagus Patients with known problems of the esophagus, such as esophageal varices, esophageal obstruction, or radiation therapy to the area of the esophagus should be evaluated carefully by the physician before having the procedure. Patients who are allergic to or sensitive to medications or latex should notify their physician. If you are pregnant or suspect that you may be pregnant, you should notify your physician. There may be other risks depending upon your specific medical condition. Be sure to discuss any concerns with your physician prior to the procedure.

## 2011-09-07 NOTE — Assessment & Plan Note (Signed)
We'll proceed with further evaluation as outlined above.  No clinical evidence of heart failure however

## 2011-09-07 NOTE — Assessment & Plan Note (Signed)
Patient reports symptoms of weakness in hips and both arms and obtained a sedimentation rate.  She can further follow up with her primary care physician also in the event of sedimentation rate is elevated.

## 2011-09-07 NOTE — Assessment & Plan Note (Signed)
No recurrent symptoms of orthostatic hypotension.

## 2011-09-07 NOTE — Assessment & Plan Note (Signed)
Patient does not report specific symptoms off chest pain during this admission.  She had a negative cardiac catheterization in 2002 and had a low risk Cardiolite study in November of 2011.

## 2011-09-07 NOTE — Assessment & Plan Note (Signed)
My initial gestalt is that this patient has severe depression.  We will proceed with examination of the cardiac structures with a TEE and possible ischemia workup is within normal limits this particular problem will need to be urgently addressed.

## 2011-09-07 NOTE — Assessment & Plan Note (Signed)
Anticoagulation provided by her primary care physician.  Goal INR is 2.5-3.5

## 2011-09-10 ENCOUNTER — Ambulatory Visit (INDEPENDENT_AMBULATORY_CARE_PROVIDER_SITE_OTHER): Payer: Medicare Other | Admitting: *Deleted

## 2011-09-10 DIAGNOSIS — Z7901 Long term (current) use of anticoagulants: Secondary | ICD-10-CM

## 2011-09-10 DIAGNOSIS — Z954 Presence of other heart-valve replacement: Secondary | ICD-10-CM

## 2011-09-10 DIAGNOSIS — I059 Rheumatic mitral valve disease, unspecified: Secondary | ICD-10-CM

## 2011-09-11 ENCOUNTER — Encounter: Payer: Self-pay | Admitting: *Deleted

## 2011-09-12 DIAGNOSIS — I059 Rheumatic mitral valve disease, unspecified: Secondary | ICD-10-CM

## 2011-09-18 ENCOUNTER — Encounter (INDEPENDENT_AMBULATORY_CARE_PROVIDER_SITE_OTHER): Payer: Medicare Other | Admitting: *Deleted

## 2011-09-18 DIAGNOSIS — R002 Palpitations: Secondary | ICD-10-CM

## 2011-09-18 NOTE — Progress Notes (Unsigned)
Patient in office for 48 hour holter monitor - applied with instructions given.  She will return monitor on 4/16 as she has coumadin visit this day.

## 2011-09-24 ENCOUNTER — Ambulatory Visit (INDEPENDENT_AMBULATORY_CARE_PROVIDER_SITE_OTHER): Payer: Medicare Other | Admitting: *Deleted

## 2011-09-24 DIAGNOSIS — Z7901 Long term (current) use of anticoagulants: Secondary | ICD-10-CM

## 2011-09-24 DIAGNOSIS — I059 Rheumatic mitral valve disease, unspecified: Secondary | ICD-10-CM

## 2011-09-24 DIAGNOSIS — Z954 Presence of other heart-valve replacement: Secondary | ICD-10-CM

## 2011-09-24 LAB — POCT INR: INR: 3.5

## 2011-10-25 ENCOUNTER — Ambulatory Visit (INDEPENDENT_AMBULATORY_CARE_PROVIDER_SITE_OTHER): Payer: Medicare Other | Admitting: *Deleted

## 2011-10-25 DIAGNOSIS — I059 Rheumatic mitral valve disease, unspecified: Secondary | ICD-10-CM

## 2011-10-25 DIAGNOSIS — Z7901 Long term (current) use of anticoagulants: Secondary | ICD-10-CM

## 2011-10-25 DIAGNOSIS — Z954 Presence of other heart-valve replacement: Secondary | ICD-10-CM

## 2011-10-30 ENCOUNTER — Encounter: Payer: Self-pay | Admitting: *Deleted

## 2011-11-01 ENCOUNTER — Encounter (INDEPENDENT_AMBULATORY_CARE_PROVIDER_SITE_OTHER): Payer: Medicare Other | Admitting: *Deleted

## 2011-11-01 DIAGNOSIS — Z7901 Long term (current) use of anticoagulants: Secondary | ICD-10-CM

## 2011-11-01 DIAGNOSIS — I059 Rheumatic mitral valve disease, unspecified: Secondary | ICD-10-CM

## 2011-11-05 ENCOUNTER — Telehealth: Payer: Self-pay | Admitting: Cardiology

## 2011-11-05 ENCOUNTER — Other Ambulatory Visit: Payer: Self-pay | Admitting: Cardiology

## 2011-11-05 ENCOUNTER — Other Ambulatory Visit: Payer: Self-pay | Admitting: *Deleted

## 2011-11-05 ENCOUNTER — Encounter: Payer: Self-pay | Admitting: *Deleted

## 2011-11-05 DIAGNOSIS — I350 Nonrheumatic aortic (valve) stenosis: Secondary | ICD-10-CM

## 2011-11-05 DIAGNOSIS — I059 Rheumatic mitral valve disease, unspecified: Secondary | ICD-10-CM

## 2011-11-05 DIAGNOSIS — I4891 Unspecified atrial fibrillation: Secondary | ICD-10-CM

## 2011-11-05 DIAGNOSIS — Z01810 Encounter for preprocedural cardiovascular examination: Secondary | ICD-10-CM

## 2011-11-05 LAB — PROTIME-INR

## 2011-11-05 NOTE — Telephone Encounter (Signed)
Right heart cath - tomorrow w/ Kirke Corin - [facility] lab 10:30

## 2011-11-05 NOTE — Telephone Encounter (Signed)
No precert required.  Medicare, Medicaid °

## 2011-11-05 NOTE — H&P (Signed)
Sela Falk De Gent, MD 09/07/2011 11:02 AM Signed  Keila Turan De Gent, MD, FACC  ABIM Board Certified in Adult Cardiovascular Medicine,Internal Medicine and Critical Care Medicine  CC:followup patient with mitral and tricuspid valve replacement complaining of shortness of breath, weakness and fatigue  HPI:  The patient is a 78-year-old female with multiple valvular heart disease, status post mitral and tricuspid valve replacement and also native moderate aortic stenosis. Her symptom complex consistent of increased shortness of breath, fatigue with no energy, occasional palpitations. The patient's daughter is with her and she said that she feels that her mother is tired all day long. She has no energy. Sometimes she lays for days in bed. She reports some atypical aching on the left side of her chest. She feels also that both arms were stiff and tight and hurting particularly when she is sleeping.she doesn't feel like cooking anymore. At times she gets very irritable because she feels tired all the time. She also has been struggling with old viral illness over the last several couple of weeks. She had a recent echocardiogram done by primary care physician with details as outlined below and was referred for further evaluation. Of note is also that we reviewed her Coumadin levels and they have been subtherapeutic for an artificial valve in the mitral position.she also been taken off Coumadin at least one time for 4 days prior to dental extraction without any bridging. She reports no orthopnea or PND  She does not report any presyncope or syncope but as outlined above she does report significant fatigue and weakness.by echocardiogram she also appears to have developed new LV systolic dysfunction with an ejection fraction of 40-45%  PMH: reviewed and listed in Problem List in Electronic Records (and see below)  Past Medical History   Diagnosis  Date   .  History of mitral valve replacement with mechanical valve      St.  Jude's bileaflet prosthesis   .  S/P tricuspid valve replacement      bioprosthetic valve   .  Chronic atrial fibrillation      currently normal sinus rhythm   .  S/P patent foramen ovale closure    .  Chronic anticoagulation    .  Nephrolithiasis    .  Irritable bowel syndrome      constipation   .  Atypical chest pain      Negative catheterization 2002, low risk Cardiolite study November 2011   .  Diabetes mellitus    .  Moderate aortic stenosis by prior echocardiogram      asymptomatic   .  Depression    .  LV dysfunction      echocardiogram March 2013 by her primary care physician ejection fraction 40-45%, moderate aortic stenosis, prosthetic valves poorly reported , small pericardial effusion.    No past surgical history on file.  Allergies/SH/FHX : available in Electronic Records for review  Allergies   Allergen  Reactions   .  Oxycodone-Acetaminophen      REACTION: gi upset    History    Social History   .  Marital Status:  Married     Spouse Name:  N/A     Number of Children:  N/A   .  Years of Education:  N/A    Occupational History   .  Not on file.    Social History Main Topics   .  Smoking status:  Never Smoker   .  Smokeless tobacco:  Never Used   .    Alcohol Use:  Not on file   .  Drug Use:  Not on file   .  Sexually Active:  Not on file    Other Topics  Concern   .  Not on file    Social History Narrative   .  No narrative on file    No family history on file.  Medications:  Current Outpatient Prescriptions   Medication  Sig  Dispense  Refill   .  ALPRAZolam (XANAX) 0.5 MG tablet  Take 0.5 mg by mouth at bedtime as needed.     .  dicyclomine (BENTYL) 20 MG tablet  Take 20 mg by mouth every 8 (eight) hours as needed. 30 minutes prior to meals     .  esomeprazole (NEXIUM) 40 MG capsule  Take 40 mg by mouth daily before breakfast.     .  furosemide (LASIX) 40 MG tablet  Take 0.5 tablets (20 mg total) by mouth daily.     .  glyBURIDE micronized  (GLYNASE) 3 MG tablet  Take 3 mg by mouth daily with breakfast.     .  metoprolol succinate (TOPROL-XL) 50 MG 24 hr tablet  Take one and one half tablet by mouth once daily with or immediately following a meal.  45 tablet  6   .  metoprolol tartrate (LOPRESSOR) 25 MG tablet  Take 25 mg by mouth 2 (two) times daily as needed.     .  polyethylene glycol (MIRALAX / GLYCOLAX) packet  Take 17 g by mouth daily as needed.     .  potassium chloride (KLOR-CON) 10 MEQ CR tablet  Take 10 mEq by mouth daily.     .  raloxifene (EVISTA) 60 MG tablet  Take 60 mg by mouth daily.     .  simvastatin (ZOCOR) 40 MG tablet  Take 40 mg by mouth at bedtime.     .  sucralfate (CARAFATE) 1 GM/10ML suspension  Take 1 g by mouth 3 (three) times daily as needed.     .  traMADol-acetaminophen (ULTRACET) 37.5-325 MG per tablet  Take 1 tablet by mouth every 6 (six) hours as needed.     .  warfarin (COUMADIN) 5 MG tablet  Take 5 mg by mouth daily. Per [facility]      ROS: No nausea or vomiting. No fever or chills.No melena or hematochezia.No bleeding.No claudication. Fatigue and cessation of depression. Decreased energy levels.  Physical Exam:  BP 102/63  Pulse 71  Ht 5' 4" (1.626 m)  Wt 151 lb (68.493 kg)  BMI 25.92 kg/m2  SpO2 97%  General:well-nourished white female complaining of generalized weakness and pale appearing  Neck:normal carotid upstroke with no carotid bruits. No thyromegaly non-nodular thyroid. JVP 6-7 cm  Lungs:clear breath sounds bilaterally. No wheezing  Cardiac:regular rate and rhythm with normal closing click of S1 and S2. No murmur rubs or gallops  Vascular:no edema. Normal distal pulses.  Skin:pale but warm and dry  Physcologic:depressed affect  12lead ECG:Single-lead telemetry strip shows normal sinus rhythm  Limited bedside ECHO:N/A  No images are attached to the encounter.  Assessment and Plan  POSTURAL HYPOTENSION  No recurrent symptoms of orthostatic hypotension.  LV dysfunction    Recent echocardiogram I primary care physician demonstrates that there is worsening of her LV function, in conjunction with her known moderate aortic stenosis. The valves are poorly evaluated. The patient does complain of increased shortness of breath and further evaluation with transesophageal echocardiogram seems reasonable. She is   status post St. Jude mitral valve replacement and bioprosthetic tricuspid valve replacement in 2002.  Risks of the Procedure  Possible risks associated with a transesophageal echocardiogram include, but are not limited to, the following:  breathing problems  heart rhythm problems  infection of the heart valves  bleeding of the esophagus Patients with known problems of the esophagus, such as esophageal varices, esophageal obstruction, or radiation therapy to the area of the esophagus should be evaluated carefully by the physician before having the procedure.  Patients who are allergic to or sensitive to medications or latex should notify their physician.  If you are pregnant or suspect that you may be pregnant, you should notify your physician.  There may be other risks depending upon your specific medical condition. Be sure to discuss any concerns with your physician prior to the procedure.  Chronic atrial fibrillation  Patient remains on anticoagulation but I have concerns about adequacy of her anticoagulation. She also told me that when she had dental work done her Coumadin was stopped 4 days prior to dental work without any bridging in the setting of a mitral valve replacement. Fortunately today on physical examination she appears to have a normal click off S1,however her increased symptoms of shortness of breath are concerning and she will need further evaluation of her mechanical valves. It is possible that her shortness of breath is due to worsening LV dysfunction and a further ischemia workup also may be needed. On the other hand she has several symptoms that are  nonspecific in particularfatigue and shortness of breath and a sensation decreased exercise tolerancewhich all could point towards depression  SHORTNESS OF BREATH  We'll proceed with further evaluation as outlined above. No clinical evidence of heart failure however  Encounter for long-term (current) use of anticoagulants  Anticoagulation provided by her primary care physician. Goal INR is 2.5-3.5  Depression  My initial gestalt is that this patient has severe depression. We will proceed with examination of the cardiac structures with a TEE and possible ischemia workup is within normal limits this particular problem will need to be urgently addressed.  Atypical chest pain  Patient does not report specific symptoms off chest pain during this admission. She had a negative cardiac catheterization in 2002 and had a low risk Cardiolite study in November of 2011.  ANEMIA  This could be exhibiting to her weakness and we will obtain blood work including CBC, BMET TSH.  Polymyalgia rheumatica  Patient reports symptoms of weakness in hips and both arms and obtained a sedimentation rate. She can further follow up with her primary care physician also in the event of sedimentation rate is elevated.   Patient Active Problem List   Diagnoses   .  ANEMIA   .  ATYPICAL DEPRESSIVE DISORDER   .  MIGRAINE HEADACHE   .  MEMORY LOSS   .  PALPITATIONS   .  SHORTNESS OF BREATH   .  CHEST PAIN   .  POSTURAL HYPOTENSION   .  WEAKNESS   .  Atypical chest pain   .  Moderate aortic stenosis by prior echocardiogram   .  Encounter for long-term (current) use of anticoagulants   .  LV dysfunction   .  Depression   .  S/P patent foramen ovale closure   .  Chronic atrial fibrillation   .  S/P tricuspid valve replacement   .  History of mitral valve replacement with mechanical valve   .  Polymyalgia rheumatica       POSTURAL HYPOTENSION - Zeus Marquis De Gent, MD 09/07/2011 10:47 AM Signed  No recurrent symptoms of  orthostatic hypotension. LV dysfunction - Elfego Giammarino De Gent, MD 09/07/2011 10:49 AM Addendum  Recent echocardiogram I primary care physician demonstrates that there is worsening of her LV function, in conjunction with her known moderate aortic stenosis. The valves are poorly evaluated. The patient does complain of increased shortness of breath and further evaluation with transesophageal echocardiogram seems reasonable. She is status post St. Jude mitral valve replacement and bioprosthetic tricuspid valve replacement in 2002.  Risks of the Procedure  Possible risks associated with a transesophageal echocardiogram include, but are not limited to, the following:  breathing problems  heart rhythm problems  infection of the heart valves  bleeding of the esophagus Patients with known problems of the esophagus, such as esophageal varices, esophageal obstruction, or radiation therapy to the area of the esophagus should be evaluated carefully by the physician before having the procedure.  Patients who are allergic to or sensitive to medications or latex should notify their physician.  If you are pregnant or suspect that you may be pregnant, you should notify your physician.  There may be other risks depending upon your specific medical condition. Be sure to discuss any concerns with your physician prior to the procedure.   Previous Version  Chronic atrial fibrillation - Ivanka Kirshner De Gent, MD 09/07/2011 10:51 AM Signed  Patient remains on anticoagulation but I have concerns about adequacy of her anticoagulation. She also told me that when she had dental work done her Coumadin was stopped 4 days prior to dental work without any bridging in the setting of a mitral valve replacement. Fortunately today on physical examination she appears to have a normal click off S1,however her increased symptoms of shortness of breath are concerning and she will need further evaluation of her mechanical valves. It is possible that her  shortness of breath is due to worsening LV dysfunction and a further ischemia workup also may be needed. On the other hand she has several symptoms that are nonspecific in particularfatigue and shortness of breath and a sensation decreased exercise tolerancewhich all could point towards depression SHORTNESS OF BREATH - Lessie Manigo De Gent, MD 09/07/2011 10:52 AM Signed  We'll proceed with further evaluation as outlined above. No clinical evidence of heart failure however Encounter for long-term (current) use of anticoagulants - Arianis Bowditch De Gent, MD 09/07/2011 10:52 AM Signed  Anticoagulation provided by her primary care physician. Goal INR is 2.5-3.5 Depression - Ramond Darnell De Gent, MD 09/07/2011 10:53 AM Signed  My initial gestalt is that this patient has severe depression. We will proceed with examination of the cardiac structures with a TEE and possible ischemia workup is within normal limits this particular problem will need to be urgently addressed. Atypical chest pain - Janah Mcculloh De Gent, MD 09/07/2011 10:53 AM Signed  Patient does not report specific symptoms off chest pain during this admission. She had a negative cardiac catheterization in 2002 and had a low risk Cardiolite study in November of 2011. ANEMIA - Paarth Cropper De Gent, MD 09/07/2011 10:54 AM Signed  This could be exhibiting to her weakness and we will obtain blood work including CBC, BMET TSH. Polymyalgia rheumatica - Dallis Darden De Gent, MD 09/07/2011 10:55 AM Signed  Patient reports symptoms of weakness in hips and both arms and obtained a sedimentation rate. She can further follow up with her primary care physician also in the event of sedimentation rate is elevated.  Videl Nobrega De Gent 11/05/2011 2:00   PM   

## 2011-11-06 ENCOUNTER — Encounter (HOSPITAL_BASED_OUTPATIENT_CLINIC_OR_DEPARTMENT_OTHER)
Admission: RE | Disposition: A | Discharge status: Home / Self Care | Payer: Self-pay | Source: Ambulatory Visit | Attending: Cardiovascular Disease

## 2011-11-06 ENCOUNTER — Inpatient Hospital Stay (HOSPITAL_BASED_OUTPATIENT_CLINIC_OR_DEPARTMENT_OTHER)
Admission: RE | Admit: 2011-11-06 | Discharge: 2011-11-06 | Disposition: A | Discharge status: Home / Self Care | Payer: Medicare Other | Source: Ambulatory Visit | Attending: Cardiovascular Disease | Admitting: Cardiovascular Disease

## 2011-11-06 DIAGNOSIS — R0602 Shortness of breath: Secondary | ICD-10-CM | POA: Insufficient documentation

## 2011-11-06 DIAGNOSIS — R5381 Other malaise: Secondary | ICD-10-CM | POA: Insufficient documentation

## 2011-11-06 DIAGNOSIS — I359 Nonrheumatic aortic valve disorder, unspecified: Secondary | ICD-10-CM | POA: Insufficient documentation

## 2011-11-06 DIAGNOSIS — Z954 Presence of other heart-valve replacement: Secondary | ICD-10-CM | POA: Insufficient documentation

## 2011-11-06 DIAGNOSIS — I350 Nonrheumatic aortic (valve) stenosis: Secondary | ICD-10-CM

## 2011-11-06 SURGERY — JV RIGHT HEART CATHETERIZATION
Anesthesia: Moderate Sedation

## 2011-11-06 MED ORDER — ACETAMINOPHEN 325 MG PO TABS: 650.0000 mg | ORAL_TABLET | ORAL | Status: DC | PRN

## 2011-11-06 MED ORDER — SODIUM CHLORIDE 0.9 % IV SOLN: 250.0000 mL | INTRAVENOUS | Status: DC | PRN

## 2011-11-06 MED ORDER — SODIUM CHLORIDE 0.9 % IJ SOLN: 3.0000 mL | INTRAMUSCULAR | Status: DC | PRN

## 2011-11-06 MED ORDER — SODIUM CHLORIDE 0.9 % IJ SOLN: 3.0000 mL | Freq: Two times a day (BID) | INTRAMUSCULAR | Status: DC

## 2011-11-06 MED ORDER — SODIUM CHLORIDE 0.9 % IV SOLN: INTRAVENOUS | Status: AC

## 2011-11-06 MED ORDER — ASPIRIN 81 MG PO CHEW
324.0000 mg | CHEWABLE_TABLET | ORAL | Status: AC
  Administered 2011-11-06: 324 mg via ORAL

## 2011-11-06 MED ORDER — SODIUM CHLORIDE 0.9 % IV SOLN
INTRAVENOUS | Status: DC
  Administered 2011-11-06: 10:00:00 via INTRAVENOUS

## 2011-11-06 MED ORDER — DIAZEPAM 5 MG PO TABS
5.0000 mg | ORAL_TABLET | ORAL | Status: AC
  Administered 2011-11-06: 5 mg via ORAL

## 2011-11-06 NOTE — Discharge Instructions (Signed)
Resume Warfarin tonight.  Resume Lovenox tomorrow AM and follow instructions of anticoagulation clinic. Monitor for signs of bleeding.

## 2011-11-06 NOTE — Interval H&P Note (Signed)
History and Physical Interval Note:  11/06/2011 10:38 AM  Elizabeth Wallace  has presented today for surgery, with the diagnosis of mitral valve AND aortic valve disorders.  The various methods of treatment have been discussed with the patient and family. After consideration of risks, benefits and other options for treatment, the patient has consented to  Procedure(s) (LRB): JV RIGHT AND LEFT HEART CATHETERIZATION (N/A) as a surgical intervention .  The patients' history has been reviewed, patient examined, no change in status, stable for surgery.  I have reviewed the patients' chart and labs.  Questions were answered to the patient's satisfaction.     Lorine Bears

## 2011-11-06 NOTE — H&P (View-Only) (Signed)
Elizabeth Bottoms, MD 09/07/2011 11:02 AM Signed  Elizabeth Bottoms, MD, [facility]  ABIM Board Certified in Adult Cardiovascular Medicine,Internal Medicine and Critical Care Medicine  [id number] patient with mitral and tricuspid valve replacement complaining of shortness of breath, weakness and fatigue  HPI:  The patient is a 78 year old female with multiple valvular heart disease, status post mitral and tricuspid valve replacement and also native moderate aortic stenosis. Her symptom complex consistent of increased shortness of breath, fatigue with no energy, occasional palpitations. The patient's daughter is with her and she said that she feels that her mother is tired all day long. She has no energy. Sometimes she lays for days in bed. She reports some atypical aching on the left side of her chest. She feels also that both arms were stiff and tight and hurting particularly when she is sleeping.she doesn't feel like cooking anymore. At times she gets very irritable because she feels tired all the time. She also has been struggling with old viral illness over the last several couple of weeks. She had a recent echocardiogram done by primary care physician with details as outlined below and was referred for further evaluation. Of note is also that we reviewed her Coumadin levels and they have been subtherapeutic for an artificial valve in the mitral position.she also been taken off Coumadin at least one time for 4 days prior to dental extraction without any bridging. She reports no orthopnea or PND  She does not report any presyncope or syncope but as outlined above she does report significant fatigue and weakness.by echocardiogram she also appears to have developed new LV systolic dysfunction with an ejection fraction of 40-45%  PMH: reviewed and listed in Problem List in Electronic Records (and see below)  Past Medical History   Diagnosis  Date   .  History of mitral valve replacement with mechanical valve      St.  Jude's bileaflet prosthesis   .  S/P tricuspid valve replacement      bioprosthetic valve   .  Chronic atrial fibrillation      currently normal sinus rhythm   .  S/P patent foramen ovale closure    .  Chronic anticoagulation    .  Nephrolithiasis    .  Irritable bowel syndrome      constipation   .  Atypical chest pain      Negative catheterization 2002, low risk Cardiolite study November 2011   .  Diabetes mellitus    .  Moderate aortic stenosis by prior echocardiogram      asymptomatic   .  Depression    .  LV dysfunction      echocardiogram March 2013 by her primary care physician ejection fraction 40-45%, moderate aortic stenosis, prosthetic valves poorly reported , small pericardial effusion.    No past surgical history on file.  Allergies/SH/FHX : available in Electronic Records for review  Allergies   Allergen  Reactions   .  Oxycodone-Acetaminophen      REACTION: gi upset    History    Social History   .  Marital Status:  Married     Spouse Name:  N/A     Number of Children:  N/A   .  Years of Education:  N/A    Occupational History   .  Not on file.    Social History Main Topics   .  Smoking status:  Never Smoker   .  Smokeless tobacco:  Never Used   .  Alcohol Use:  Not on file   .  Drug Use:  Not on file   .  Sexually Active:  Not on file    Other Topics  Concern   .  Not on file    Social History Narrative   .  No narrative on file    No family history on file.  Medications:  Current Outpatient Prescriptions   Medication  Sig  Dispense  Refill   .  ALPRAZolam (XANAX) 0.5 MG tablet  Take 0.5 mg by mouth at bedtime as needed.     .  dicyclomine (BENTYL) 20 MG tablet  Take 20 mg by mouth every 8 (eight) hours as needed. 30 minutes prior to meals     .  esomeprazole (NEXIUM) 40 MG capsule  Take 40 mg by mouth daily before breakfast.     .  furosemide (LASIX) 40 MG tablet  Take 0.5 tablets (20 mg total) by mouth daily.     Marland Kitchen  glyBURIDE micronized  (GLYNASE) 3 MG tablet  Take 3 mg by mouth daily with breakfast.     .  metoprolol succinate (TOPROL-XL) 50 MG 24 hr tablet  Take one and one half tablet by mouth once daily with or immediately following a meal.  45 tablet  6   .  metoprolol tartrate (LOPRESSOR) 25 MG tablet  Take 25 mg by mouth 2 (two) times daily as needed.     .  polyethylene glycol (MIRALAX / GLYCOLAX) packet  Take 17 g by mouth daily as needed.     .  potassium chloride (KLOR-CON) 10 MEQ CR tablet  Take 10 mEq by mouth daily.     .  raloxifene (EVISTA) 60 MG tablet  Take 60 mg by mouth daily.     .  simvastatin (ZOCOR) 40 MG tablet  Take 40 mg by mouth at bedtime.     .  sucralfate (CARAFATE) 1 GM/10ML suspension  Take 1 g by mouth 3 (three) times daily as needed.     .  traMADol-acetaminophen (ULTRACET) 37.5-325 MG per tablet  Take 1 tablet by mouth every 6 (six) hours as needed.     .  warfarin (COUMADIN) 5 MG tablet  Take 5 mg by mouth daily. Per [facility]      ROS: No nausea or vomiting. No fever or chills.No melena or hematochezia.No bleeding.No claudication. Fatigue and cessation of depression. Decreased energy levels.  Physical Exam:  BP 102/63  Pulse 71  Ht 5\' 4"  (1.626 m)  Wt 151 lb (68.493 kg)  BMI 25.92 kg/m2  SpO2 97%  General:well-nourished white female complaining of generalized weakness and pale appearing  Neck:normal carotid upstroke with no carotid bruits. No thyromegaly non-nodular thyroid. JVP 6-7 cm  Lungs:clear breath sounds bilaterally. No wheezing  Cardiac:regular rate and rhythm with normal closing click of S1 and S2. No murmur rubs or gallops  Vascular:no edema. Normal distal pulses.  Skin:pale but warm and dry  Physcologic:depressed affect  12lead WUJ:[id number] telemetry strip shows normal sinus rhythm  Limited bedside ECHO:N/A  No images are attached to the encounter.  Assessment and Plan  POSTURAL HYPOTENSION  No recurrent symptoms of orthostatic hypotension.  LV dysfunction    Recent echocardiogram I primary care physician demonstrates that there is worsening of her LV function, in conjunction with her known moderate aortic stenosis. The valves are poorly evaluated. The patient does complain of increased shortness of breath and further evaluation with transesophageal echocardiogram seems reasonable. She is  status post St. Jude mitral valve replacement and bioprosthetic tricuspid valve replacement in 2002.  Risks of the Procedure  Possible risks associated with a transesophageal echocardiogram include, but are not limited to, the following:  breathing problems  heart rhythm problems  infection of the heart valves  bleeding of the esophagus Patients with known problems of the esophagus, such as esophageal varices, esophageal obstruction, or radiation therapy to the area of the esophagus should be evaluated carefully by the physician before having the procedure.  Patients who are allergic to or sensitive to medications or latex should notify their physician.  If you are pregnant or suspect that you may be pregnant, you should notify your physician.  There may be other risks depending upon your specific medical condition. Be sure to discuss any concerns with your physician prior to the procedure.  Chronic atrial fibrillation  Patient remains on anticoagulation but I have concerns about adequacy of her anticoagulation. She also told me that when she had dental work done her Coumadin was stopped 4 days prior to dental work without any bridging in the setting of a mitral valve replacement. Fortunately today on physical examination she appears to have a normal click off S1,however her increased symptoms of shortness of breath are concerning and she will need further evaluation of her mechanical valves. It is possible that her shortness of breath is due to worsening LV dysfunction and a further ischemia workup also may be needed. On the other hand she has several symptoms that are  nonspecific in particularfatigue and shortness of breath and a sensation decreased exercise tolerancewhich all could point towards depression  SHORTNESS OF BREATH  We'll proceed with further evaluation as outlined above. No clinical evidence of heart failure however  Encounter for long-term (current) use of anticoagulants  Anticoagulation provided by her primary care physician. Goal INR is 2.5-3.5  Depression  My initial gestalt is that this patient has severe depression. We will proceed with examination of the cardiac structures with a TEE and possible ischemia workup is within normal limits this particular problem will need to be urgently addressed.  Atypical chest pain  Patient does not report specific symptoms off chest pain during this admission. She had a negative cardiac catheterization in 2002 and had a low risk Cardiolite study in November of 2011.  ANEMIA  This could be exhibiting to her weakness and we will obtain blood work including CBC, BMET TSH.  Polymyalgia rheumatica  Patient reports symptoms of weakness in hips and both arms and obtained a sedimentation rate. She can further follow up with her primary care physician also in the event of sedimentation rate is elevated.   Patient Active Problem List   Diagnoses   .  ANEMIA   .  ATYPICAL DEPRESSIVE DISORDER   .  MIGRAINE HEADACHE   .  MEMORY LOSS   .  PALPITATIONS   .  SHORTNESS OF BREATH   .  CHEST PAIN   .  POSTURAL HYPOTENSION   .  WEAKNESS   .  Atypical chest pain   .  Moderate aortic stenosis by prior echocardiogram   .  Encounter for long-term (current) use of anticoagulants   .  LV dysfunction   .  Depression   .  S/P patent foramen ovale closure   .  Chronic atrial fibrillation   .  S/P tricuspid valve replacement   .  History of mitral valve replacement with mechanical valve   .  Polymyalgia rheumatica  POSTURAL HYPOTENSION - Elizabeth Bottoms, MD 09/07/2011 10:47 AM Signed  No recurrent symptoms of  orthostatic hypotension. LV dysfunction - Elizabeth Bottoms, MD 09/07/2011 10:49 AM Addendum  Recent echocardiogram I primary care physician demonstrates that there is worsening of her LV function, in conjunction with her known moderate aortic stenosis. The valves are poorly evaluated. The patient does complain of increased shortness of breath and further evaluation with transesophageal echocardiogram seems reasonable. She is status post St. Jude mitral valve replacement and bioprosthetic tricuspid valve replacement in 2002.  Risks of the Procedure  Possible risks associated with a transesophageal echocardiogram include, but are not limited to, the following:  breathing problems  heart rhythm problems  infection of the heart valves  bleeding of the esophagus Patients with known problems of the esophagus, such as esophageal varices, esophageal obstruction, or radiation therapy to the area of the esophagus should be evaluated carefully by the physician before having the procedure.  Patients who are allergic to or sensitive to medications or latex should notify their physician.  If you are pregnant or suspect that you may be pregnant, you should notify your physician.  There may be other risks depending upon your specific medical condition. Be sure to discuss any concerns with your physician prior to the procedure.   Previous Version  Chronic atrial fibrillation - Elizabeth Bottoms, MD 09/07/2011 10:51 AM Signed  Patient remains on anticoagulation but I have concerns about adequacy of her anticoagulation. She also told me that when she had dental work done her Coumadin was stopped 4 days prior to dental work without any bridging in the setting of a mitral valve replacement. Fortunately today on physical examination she appears to have a normal click off S1,however her increased symptoms of shortness of breath are concerning and she will need further evaluation of her mechanical valves. It is possible that her  shortness of breath is due to worsening LV dysfunction and a further ischemia workup also may be needed. On the other hand she has several symptoms that are nonspecific in particularfatigue and shortness of breath and a sensation decreased exercise tolerancewhich all could point towards depression SHORTNESS OF BREATH - Elizabeth Bottoms, MD 09/07/2011 10:52 AM Signed  We'll proceed with further evaluation as outlined above. No clinical evidence of heart failure however Encounter for long-term (current) use of anticoagulants - Elizabeth Bottoms, MD 09/07/2011 10:52 AM Signed  Anticoagulation provided by her primary care physician. Goal INR is 2.5-3.5 Depression - Elizabeth Bottoms, MD 09/07/2011 10:53 AM Signed  My initial gestalt is that this patient has severe depression. We will proceed with examination of the cardiac structures with a TEE and possible ischemia workup is within normal limits this particular problem will need to be urgently addressed. Atypical chest pain - Elizabeth Bottoms, MD 09/07/2011 10:53 AM Signed  Patient does not report specific symptoms off chest pain during this admission. She had a negative cardiac catheterization in 2002 and had a low risk Cardiolite study in November of 2011. ANEMIA - Elizabeth Bottoms, MD 09/07/2011 10:54 AM Signed  This could be exhibiting to her weakness and we will obtain blood work including CBC, BMET TSH. Polymyalgia rheumatica - Elizabeth Bottoms, MD 09/07/2011 10:55 AM Signed  Patient reports symptoms of weakness in hips and both arms and obtained a sedimentation rate. She can further follow up with her primary care physician also in the event of sedimentation rate is elevated.  Alvin Critchley [facility] 11/05/2011 2:00  PM   

## 2011-11-06 NOTE — CV Procedure (Signed)
   Cardiac Catheterization Procedure Note  Name: DENIS KOPPEL MRN: [id number] DOB: June 26, 1933  Procedure: Right Heart Cath (not completed due to a mechanical tricuspid valve), Left Heart Cath, Selective Coronary Angiography, LV angiography  Indication: Valvular heart disease.   Procedural Details: The right groin was prepped, draped, and anesthetized with 1% lidocaine. Using the modified Seldinger technique a 5 French sheath was placed in the right femoral artery and a 7 French sheath was placed in the right femoral vein. A Swan-Ganz catheter was used for the right heart catheterization. Right atrial pressure was recorded. It was then noted that there was a mechanical tricuspid valve. This was verified by operative report which indicated a St. Jude mechanical valve and the tricuspid and mitral positions. Thus, the right heart catheterization was aborted at this time.  Standard Judkins catheters were used for selective coronary angiography and left ventriculography. A JL 5 catheter was used to engage the left main coronary artery. There was no significant difficulty in crossing the aortic valve. There were no immediate procedural complications. The patient was transferred to the post catheterization recovery area for further monitoring.  Procedural Findings: Hemodynamics RA 11 mmHg  LV 127/12 mmHg . LVEDP: 19 mmHg AO 116/53 mmHg  Oxygen saturations:  Aortic Valve: Peak to Peak gradient: 11 mmHg , Mean gradient: 10 mmHg.  Fluoroscopy: Normal leaflet motion of both mitral and tricuspid St. Jude valves. Only mild aortic valve calcifications.  Coronary angiography: Coronary dominance: Right.   Left Main:  Normal in size with no significant disease.  Left Anterior Descending (LAD):  Normal size with minor irregularities. The vessel is tortuous in the distal segment.  1st diagonal (D1):  Large in size and high takeoff. It covers portion of the left circumflex distribution. It has minor  irregularities without obstructive disease.  2nd diagonal (D2):  Small in size and free of significant disease.  3rd diagonal (D3):  Small in size and free of significant disease.  Circumflex (LCx):  Small in size and nondominant. The vessel has minor irregularities without obstructive disease.  Right Coronary Artery: Large in size and dominant. No significant disease is noted.  posterior descending artery: Large in size with no significant disease.  posterior lateral branch:  3 medium size branches which are free of significant disease.  Left ventriculography: Left ventricular systolic function is normal , LVEF is estimated at 60 %, there is no significant mitral regurgitation   Final Conclusions:   1. Normal functioning prosthetic tricuspid and mitral valves by fluoroscopy. 2. Mild aortic stenosis with a peak to peak gradient of 11 mm mercury. 3. Normal LV systolic function without significant mitral regurgitation. 4. No significant coronary artery disease.  Recommendations:  Continue medical therapy.  Lorine Bears MD, [facility] 11/06/2011, 11:19 AM

## 2011-11-06 NOTE — Progress Notes (Signed)
Bedrest begins @ 1130.

## 2011-11-08 LAB — POCT I-STAT GLUCOSE
Glucose, Bld: 90 mg/dL (ref 70–99)
Operator id: 221371

## 2011-11-08 LAB — POCT I-STAT 3, ART BLOOD GAS (G3+)
Acid-Base Excess: 2 mmol/L (ref 0.0–2.0)
pCO2 arterial: 44.8 mmHg (ref 35.0–45.0)
pO2, Arterial: 65 mmHg — ABNORMAL LOW (ref 80.0–100.0)

## 2011-11-12 ENCOUNTER — Ambulatory Visit (INDEPENDENT_AMBULATORY_CARE_PROVIDER_SITE_OTHER): Payer: Medicare Other | Admitting: *Deleted

## 2011-11-12 DIAGNOSIS — Z7901 Long term (current) use of anticoagulants: Secondary | ICD-10-CM

## 2011-11-12 DIAGNOSIS — Z954 Presence of other heart-valve replacement: Secondary | ICD-10-CM

## 2011-11-12 DIAGNOSIS — I059 Rheumatic mitral valve disease, unspecified: Secondary | ICD-10-CM

## 2011-11-12 LAB — POCT INR: INR: 2.3

## 2011-11-26 ENCOUNTER — Ambulatory Visit (INDEPENDENT_AMBULATORY_CARE_PROVIDER_SITE_OTHER): Payer: Medicare Other | Admitting: *Deleted

## 2011-11-26 DIAGNOSIS — Z7901 Long term (current) use of anticoagulants: Secondary | ICD-10-CM

## 2011-11-26 DIAGNOSIS — Z954 Presence of other heart-valve replacement: Secondary | ICD-10-CM

## 2011-11-26 DIAGNOSIS — I059 Rheumatic mitral valve disease, unspecified: Secondary | ICD-10-CM

## 2011-12-06 ENCOUNTER — Encounter: Payer: Self-pay | Admitting: Cardiology

## 2011-12-06 ENCOUNTER — Ambulatory Visit (INDEPENDENT_AMBULATORY_CARE_PROVIDER_SITE_OTHER): Payer: Medicare Other | Admitting: *Deleted

## 2011-12-06 ENCOUNTER — Ambulatory Visit (INDEPENDENT_AMBULATORY_CARE_PROVIDER_SITE_OTHER): Payer: Medicare Other | Admitting: Cardiology

## 2011-12-06 VITALS — BP 105/68 | HR 63 | Ht 64.0 in | Wt 158.8 lb

## 2011-12-06 DIAGNOSIS — Z7901 Long term (current) use of anticoagulants: Secondary | ICD-10-CM

## 2011-12-06 DIAGNOSIS — I4891 Unspecified atrial fibrillation: Secondary | ICD-10-CM

## 2011-12-06 DIAGNOSIS — Z952 Presence of prosthetic heart valve: Secondary | ICD-10-CM

## 2011-12-06 DIAGNOSIS — I519 Heart disease, unspecified: Secondary | ICD-10-CM

## 2011-12-06 DIAGNOSIS — I482 Chronic atrial fibrillation, unspecified: Secondary | ICD-10-CM

## 2011-12-06 DIAGNOSIS — I059 Rheumatic mitral valve disease, unspecified: Secondary | ICD-10-CM

## 2011-12-06 DIAGNOSIS — I739 Peripheral vascular disease, unspecified: Secondary | ICD-10-CM

## 2011-12-06 DIAGNOSIS — R079 Chest pain, unspecified: Secondary | ICD-10-CM

## 2011-12-06 DIAGNOSIS — I872 Venous insufficiency (chronic) (peripheral): Secondary | ICD-10-CM

## 2011-12-06 DIAGNOSIS — Z954 Presence of other heart-valve replacement: Secondary | ICD-10-CM

## 2011-12-06 NOTE — Assessment & Plan Note (Signed)
Normal mechanical valve function by TEE.

## 2011-12-06 NOTE — Progress Notes (Signed)
Elizabeth Bottoms, MD, [facility] ABIM Board Certified in Adult Cardiovascular Medicine,Internal Medicine and Critical Care Medicine    CC: pain in lower extremities  HPI:  Patient has a history of mitral valve replacement with a mechanical procedures as well as a bioprosthetic tricuspid valve. She underwent a TEE which showed stable prosthesis or tricuspid and mitral. Ejection fraction stable at 40-45% and the patient reports no heart failure symptoms. She has no chest pain shortness of breath orthopnea PND. She feels her breathing is improved quite a bit. She also has no palpitations. She reports no fever or chills She does complain of pain in the lower extremities particularly upon walking. She also significant lower extremity chronic venous insufficiency with chronic pain at night. The patient has also been diagnosed with nasal cancer and will need surgery in the next couple of weeks. She will require bridging with Lovenox.  PMH: reviewed and listed in Problem List in Electronic Records (and see below) Past Medical History  Diagnosis Date  . History of mitral valve replacement with mechanical valve      St. Jude's bileaflet prosthesis  . S/P tricuspid valve replacement      bioprosthetic valve  . Chronic atrial fibrillation     currently normal sinus rhythm  . S/P patent foramen ovale closure   . Chronic anticoagulation   . Nephrolithiasis   . Irritable bowel syndrome     constipation  . Atypical chest pain     Negative catheterization 2002, low risk Cardiolite study November 2011  . Diabetes mellitus   . Moderate aortic stenosis by prior echocardiogram      asymptomatic  . Depression   . LV dysfunction     echocardiogram March 2013 by her primary care physician ejection fraction 40-45%, moderate aortic stenosis, prosthetic valves poorly reported , small pericardial effusion.   No past surgical history on file.  Allergies/SH/FHX : available in Electronic Records for review    Allergies  Allergen Reactions  . Oxycodone-Acetaminophen     REACTION: gi upset   History   Social History  . Marital Status: Married    Spouse Name: N/A    Number of Children: N/A  . Years of Education: N/A   Occupational History  . Not on file.   Social History Main Topics  . Smoking status: Never Smoker   . Smokeless tobacco: Never Used  . Alcohol Use: Not on file  . Drug Use: Not on file  . Sexually Active: Not on file   Other Topics Concern  . Not on file   Social History Narrative  . No narrative on file   No family history on file.  Medications: Current Outpatient Prescriptions  Medication Sig Dispense Refill  . ALPRAZolam (XANAX) 0.5 MG tablet Take 0.5 mg by mouth 3 (three) times daily.       Marland Kitchen amoxicillin (AMOXIL) 500 MG capsule Take 500 mg by mouth 3 (three) times daily. For 10 days.      Marland Kitchen aspirin 81 MG tablet Take 81 mg by mouth daily.      Marland Kitchen dicyclomine (BENTYL) 20 MG tablet Take 20 mg by mouth 3 (three) times daily. 30 minutes prior to meals      . esomeprazole (NEXIUM) 40 MG capsule Take 40 mg by mouth daily before breakfast.        . furosemide (LASIX) 40 MG tablet Take 0.5 tablets (20 mg total) by mouth daily.      Marland Kitchen glyBURIDE micronized (GLYNASE) 3  MG tablet Take 1.5 mg by mouth daily with breakfast.       . metoprolol (LOPRESSOR) 50 MG tablet Take 75 mg by mouth daily.      . metoprolol succinate (TOPROL-XL) 50 MG 24 hr tablet Take one and one half tablet by mouth once daily with or immediately following a meal.  45 tablet  6  . polyethylene glycol (MIRALAX / GLYCOLAX) packet Take 17 g by mouth daily.       . potassium chloride (KLOR-CON) 10 MEQ CR tablet Take 10 mEq by mouth daily.        . raloxifene (EVISTA) 60 MG tablet Take 60 mg by mouth daily.        . simvastatin (ZOCOR) 40 MG tablet Take 40 mg by mouth at bedtime.        . sucralfate (CARAFATE) 1 GM/10ML suspension Take 1 g by mouth 3 (three) times daily as needed.        .  traMADol-acetaminophen (ULTRACET) 37.5-325 MG per tablet Take 1 tablet by mouth 3 (three) times daily.         ROS: No nausea or vomiting. No fever or chills.No melena or hematochezia.No bleeding.No claudication  Physical Exam: BP 105/68  Pulse 63  Ht 5\' 4"  (1.626 m)  Wt 158 lb 12.8 oz (72.031 kg)  BMI 27.26 kg/m2 General: Well-nourished white female pale appearing but in no distress Neck: Normal carotid upstroke no carotid bruits. No thyromegaly. Nonnodular thyroid. JVP is 8 cm Chest: Well-healed tenotomy scar. Lungs: Clear breath sounds bilaterally with no wheezing Cardiac: Regular rate and rhythm with normal closing click of S1 very crisp-S2 and no murmur rubs or gallops Vascular: No edema. Significant chronic venous insufficiency particularly in both lower extremities. There is stasis dermatitis in both feet. Also, dorsalis pedis and posterior tibial pulses are faintly palpated. In addition there is poor capillary refill Skin: Warm and dry Physcologic: Normal affect  12lead ECG: Not obtained Limited bedside ECHO:N/A No images are attached to the encounter.   Assessment and Plan  CHEST PAIN Resolved. Stable  Chronic atrial fibrillation Normal sinus rhythm. The patient reports no blood the patient's. Continue current medical regimen  Encounter for long-term (current) use of anticoagulants Patient will need nasal surgery for a nasal cancer. We will notify her Coumadin clinic to bridge her with Lovenox at the time of surgery. Patient at high risk with a mechanical valve in the mitral position.  LV dysfunction No evidence of heart failure symptoms. Ejection fraction stable at 40-45%. TEE stable.  History of mitral valve replacement with mechanical valve Normal mechanical valve function by TEE.  S/P tricuspid valve replacement Stable bioprosthesis.  Chronic venous insufficiency Patient does report symptoms that could be consistent with claudication. She has poor capillary  refill and I could not palpate her dorsalis pedis and posterior tibial pulses.   Patient Active Problem List  Diagnosis  . ANEMIA  . ATYPICAL DEPRESSIVE DISORDER  . MIGRAINE HEADACHE  . MEMORY LOSS  . PALPITATIONS  . SHORTNESS OF BREATH  . CHEST PAIN  . POSTURAL HYPOTENSION  . WEAKNESS  . Atypical chest pain  . Moderate aortic stenosis by prior echocardiogram  . Encounter for long-term (current) use of anticoagulants  . LV dysfunction  . Depression  . S/P patent foramen ovale closure  . Chronic atrial fibrillation  . S/P tricuspid valve replacement  . History of mitral valve replacement with mechanical valve  . Polymyalgia rheumatica  . Chronic venous insufficiency

## 2011-12-06 NOTE — Patient Instructions (Addendum)
   LE arterial doppler (ABI's)  Office will contact with result Your physician wants you to follow up in: 6 months.  You will receive a reminder letter in the mail one-two months in advance.  If you don't receive a letter, please call our office to schedule the follow up appointment

## 2011-12-06 NOTE — Assessment & Plan Note (Signed)
Patient will need nasal surgery for a nasal cancer. We will notify her Coumadin clinic to bridge her with Lovenox at the time of surgery. Patient at high risk with a mechanical valve in the mitral position.

## 2011-12-06 NOTE — Assessment & Plan Note (Signed)
·   Resolved  · Stable

## 2011-12-06 NOTE — Assessment & Plan Note (Signed)
Normal sinus rhythm. The patient reports no blood the patient's. Continue current medical regimen

## 2011-12-06 NOTE — Assessment & Plan Note (Signed)
Stable bioprosthesis.

## 2011-12-06 NOTE — Assessment & Plan Note (Signed)
Patient does report symptoms that could be consistent with claudication. She has poor capillary refill and I could not palpate her dorsalis pedis and posterior tibial pulses.

## 2011-12-06 NOTE — Assessment & Plan Note (Signed)
No evidence of heart failure symptoms. Ejection fraction stable at 40-45%. TEE stable.

## 2012-01-02 ENCOUNTER — Encounter (INDEPENDENT_AMBULATORY_CARE_PROVIDER_SITE_OTHER): Payer: Medicare Other

## 2012-01-02 DIAGNOSIS — I739 Peripheral vascular disease, unspecified: Secondary | ICD-10-CM

## 2012-01-02 DIAGNOSIS — E1159 Type 2 diabetes mellitus with other circulatory complications: Secondary | ICD-10-CM

## 2012-01-03 ENCOUNTER — Ambulatory Visit (INDEPENDENT_AMBULATORY_CARE_PROVIDER_SITE_OTHER): Payer: Medicare Other | Admitting: *Deleted

## 2012-01-03 DIAGNOSIS — Z954 Presence of other heart-valve replacement: Secondary | ICD-10-CM

## 2012-01-03 DIAGNOSIS — Z7901 Long term (current) use of anticoagulants: Secondary | ICD-10-CM

## 2012-01-03 DIAGNOSIS — I059 Rheumatic mitral valve disease, unspecified: Secondary | ICD-10-CM

## 2012-01-03 LAB — POCT INR: INR: 1.8

## 2012-01-08 ENCOUNTER — Encounter: Payer: Self-pay | Admitting: *Deleted

## 2012-01-15 DIAGNOSIS — N200 Calculus of kidney: Secondary | ICD-10-CM | POA: Insufficient documentation

## 2012-01-17 ENCOUNTER — Ambulatory Visit: Payer: Self-pay | Admitting: *Deleted

## 2012-01-17 ENCOUNTER — Ambulatory Visit (INDEPENDENT_AMBULATORY_CARE_PROVIDER_SITE_OTHER): Payer: Medicare Other | Admitting: *Deleted

## 2012-01-17 DIAGNOSIS — I059 Rheumatic mitral valve disease, unspecified: Secondary | ICD-10-CM

## 2012-01-17 DIAGNOSIS — Z7901 Long term (current) use of anticoagulants: Secondary | ICD-10-CM

## 2012-01-17 DIAGNOSIS — Z954 Presence of other heart-valve replacement: Secondary | ICD-10-CM

## 2012-02-07 ENCOUNTER — Ambulatory Visit (INDEPENDENT_AMBULATORY_CARE_PROVIDER_SITE_OTHER): Payer: Medicare Other | Admitting: *Deleted

## 2012-02-07 DIAGNOSIS — I059 Rheumatic mitral valve disease, unspecified: Secondary | ICD-10-CM

## 2012-02-07 DIAGNOSIS — Z954 Presence of other heart-valve replacement: Secondary | ICD-10-CM

## 2012-02-07 DIAGNOSIS — Z7901 Long term (current) use of anticoagulants: Secondary | ICD-10-CM

## 2012-02-21 ENCOUNTER — Ambulatory Visit (INDEPENDENT_AMBULATORY_CARE_PROVIDER_SITE_OTHER): Payer: Medicare Other | Admitting: *Deleted

## 2012-02-21 DIAGNOSIS — Z954 Presence of other heart-valve replacement: Secondary | ICD-10-CM

## 2012-02-21 DIAGNOSIS — I059 Rheumatic mitral valve disease, unspecified: Secondary | ICD-10-CM

## 2012-02-21 DIAGNOSIS — Z7901 Long term (current) use of anticoagulants: Secondary | ICD-10-CM

## 2012-02-25 ENCOUNTER — Encounter: Payer: Self-pay | Admitting: *Deleted

## 2012-02-25 ENCOUNTER — Telehealth: Payer: Self-pay | Admitting: Cardiology

## 2012-02-25 DIAGNOSIS — Z954 Presence of other heart-valve replacement: Secondary | ICD-10-CM

## 2012-02-25 DIAGNOSIS — Z7901 Long term (current) use of anticoagulants: Secondary | ICD-10-CM

## 2012-02-25 NOTE — Telephone Encounter (Signed)
Cesuroxime Axetil   250mg   2x daily   Started taking 9/2

## 2012-02-25 NOTE — Telephone Encounter (Signed)
Spoke with pt.  No document interaction with warfarin and ceftin.  Has appt for INR check next week.  Continue current dose of coumadin.

## 2012-02-25 NOTE — Progress Notes (Signed)
This encounter was created in error - please disregard.  This encounter was created in error - please disregard.

## 2012-03-06 ENCOUNTER — Ambulatory Visit (INDEPENDENT_AMBULATORY_CARE_PROVIDER_SITE_OTHER): Payer: Medicare Other | Admitting: *Deleted

## 2012-03-06 DIAGNOSIS — I059 Rheumatic mitral valve disease, unspecified: Secondary | ICD-10-CM

## 2012-03-06 DIAGNOSIS — Z954 Presence of other heart-valve replacement: Secondary | ICD-10-CM

## 2012-03-06 DIAGNOSIS — Z7901 Long term (current) use of anticoagulants: Secondary | ICD-10-CM

## 2012-03-06 LAB — POCT INR: INR: 1.9

## 2012-03-20 ENCOUNTER — Ambulatory Visit (INDEPENDENT_AMBULATORY_CARE_PROVIDER_SITE_OTHER): Payer: Medicare Other | Admitting: *Deleted

## 2012-03-20 DIAGNOSIS — Z7901 Long term (current) use of anticoagulants: Secondary | ICD-10-CM

## 2012-03-20 DIAGNOSIS — Z954 Presence of other heart-valve replacement: Secondary | ICD-10-CM

## 2012-03-20 DIAGNOSIS — I059 Rheumatic mitral valve disease, unspecified: Secondary | ICD-10-CM

## 2012-03-23 ENCOUNTER — Other Ambulatory Visit: Payer: Self-pay | Admitting: Cardiology

## 2012-04-01 ENCOUNTER — Encounter: Payer: Self-pay | Admitting: *Deleted

## 2012-05-18 ENCOUNTER — Ambulatory Visit: Payer: Medicare Other | Admitting: Cardiology

## 2012-05-27 DIAGNOSIS — I350 Nonrheumatic aortic (valve) stenosis: Secondary | ICD-10-CM | POA: Insufficient documentation

## 2012-05-27 DIAGNOSIS — Z9889 Other specified postprocedural states: Secondary | ICD-10-CM | POA: Insufficient documentation

## 2012-05-27 DIAGNOSIS — I10 Essential (primary) hypertension: Secondary | ICD-10-CM | POA: Insufficient documentation

## 2012-05-27 DIAGNOSIS — Z0389 Encounter for observation for other suspected diseases and conditions ruled out: Secondary | ICD-10-CM | POA: Insufficient documentation

## 2012-06-17 ENCOUNTER — Other Ambulatory Visit: Payer: Self-pay | Admitting: Cardiology

## 2012-06-17 MED ORDER — METOPROLOL TARTRATE 50 MG PO TABS: 75.0000 mg | ORAL_TABLET | Freq: Every day | ORAL | Status: DC

## 2012-06-22 ENCOUNTER — Ambulatory Visit: Payer: Self-pay | Admitting: *Deleted

## 2012-06-22 DIAGNOSIS — Z954 Presence of other heart-valve replacement: Secondary | ICD-10-CM

## 2012-06-22 DIAGNOSIS — Z7901 Long term (current) use of anticoagulants: Secondary | ICD-10-CM

## 2012-08-17 ENCOUNTER — Other Ambulatory Visit: Payer: Self-pay | Admitting: Physician Assistant

## 2012-08-18 ENCOUNTER — Other Ambulatory Visit: Payer: Self-pay | Admitting: Physician Assistant

## 2012-10-22 ENCOUNTER — Encounter: Payer: Self-pay | Admitting: Physician Assistant

## 2012-10-22 ENCOUNTER — Ambulatory Visit (INDEPENDENT_AMBULATORY_CARE_PROVIDER_SITE_OTHER): Payer: Medicare Other | Admitting: Physician Assistant

## 2012-10-22 VITALS — BP 102/72 | HR 83 | Ht 64.0 in | Wt 154.1 lb

## 2012-10-22 DIAGNOSIS — I482 Chronic atrial fibrillation, unspecified: Secondary | ICD-10-CM

## 2012-10-22 DIAGNOSIS — I519 Heart disease, unspecified: Secondary | ICD-10-CM

## 2012-10-22 DIAGNOSIS — R0609 Other forms of dyspnea: Secondary | ICD-10-CM

## 2012-10-22 DIAGNOSIS — Z954 Presence of other heart-valve replacement: Secondary | ICD-10-CM

## 2012-10-22 DIAGNOSIS — I4891 Unspecified atrial fibrillation: Secondary | ICD-10-CM

## 2012-10-22 DIAGNOSIS — Z952 Presence of prosthetic heart valve: Secondary | ICD-10-CM

## 2012-10-22 NOTE — Assessment & Plan Note (Signed)
Normalized LVF (EF 55-60%), by TEE in October 2013. Negative cardiac catheterization in 2002, low risk Cardiolite study, November 2011.

## 2012-10-22 NOTE — Assessment & Plan Note (Signed)
Adequately rate controlled on current dose of Toprol. On chronic Coumadin anticoagulation, followed by primary M.D.

## 2012-10-22 NOTE — Assessment & Plan Note (Signed)
Will reassess integrity of TVR, by echocardiography

## 2012-10-22 NOTE — Patient Instructions (Addendum)
   Lab for BMET, BNP - today  Echo   Office will contact with results  Stop Lopressor (Metoprolol Tartrate)  Continue with the Toprol XL (Metoprolol Succinate) Continue all other current medications.  Follow up in  1 month

## 2012-10-22 NOTE — Progress Notes (Addendum)
Primary Cardiologist: Simona Huh, MD (new)   HPI: Patient returns to our office after a prolonged hiatus, last seen here in June 2013, by Dr. Andee Lineman. Her cardiac history is notable for permanent atrial fibrillation and valvular heart disease, status post mechanical MVR and bioprosthetic TVR. She also has history of mild LVD (EF 40-45%), but with NL LVF, by most recent assessment in October 2013, as follows:   - Transesophageal echocardiogram, 03/2012: EF 55-60%; no mass/thrombus or shunt/PFO; NL functioning mechanical MV and bileaflet mechanical TV; moderate aortic leaflet calcification with AVA 0.8 cm (by planimetry), and mild AR; grade 2 atheromatous disease in ascending aorta/transverse aorta, and grade 3 atheroma in the descending aorta  She also has history of atypical CP with a negative catheterization in 2002, and a low risk Cardiolite study in November 2011.  Clinically, she suggests progressive DOE over the past year, as well as some orthopnea, occasional PND, and mild exacerbation of her chronic peripheral edema. She denies any exertional CP. Of note, however, she has lost 4 pounds, since her last OV with [place], here in our [place] clinic.   12-lead EKG today, reviewed by me, indicates atrial fibrillation 76 bpm; borderline RAD; NSST changes  Allergies  Allergen Reactions  . Oxycodone-Acetaminophen     REACTION: gi upset    Current Outpatient Prescriptions  Medication Sig Dispense Refill  . ALPRAZolam (XANAX) 0.5 MG tablet Take 0.5 mg by mouth 3 (three) times daily.       Marland Kitchen dicyclomine (BENTYL) 20 MG tablet Take 20 mg by mouth 3 (three) times daily. 30 minutes prior to meals      . esomeprazole (NEXIUM) 40 MG capsule Take 40 mg by mouth daily before breakfast.        . furosemide (LASIX) 40 MG tablet Take 0.5 tablets (20 mg total) by mouth daily.      Marland Kitchen glyBURIDE micronized (GLYNASE) 3 MG tablet Take 1.5 mg by mouth daily with breakfast.       . polyethylene glycol (MIRALAX /  GLYCOLAX) packet Take 17 g by mouth daily.       . potassium chloride (KLOR-CON) 10 MEQ CR tablet Take 10 mEq by mouth daily.        . raloxifene (EVISTA) 60 MG tablet Take 60 mg by mouth daily.        . simvastatin (ZOCOR) 40 MG tablet Take 40 mg by mouth at bedtime.        . sucralfate (CARAFATE) 1 GM/10ML suspension Take 1 g by mouth 3 (three) times daily as needed.        . TOPROL XL 50 MG 24 hr tablet TAKE 1 & 1/2 TABLETS BY MOUTH ONCE DAILY.  45 tablet  1  . traMADol-acetaminophen (ULTRACET) 37.5-325 MG per tablet Take 1 tablet by mouth 3 (three) times daily.       Marland Kitchen warfarin (COUMADIN) 5 MG tablet Take 5 mg by mouth daily. 5mg  T, W, Th, Saturday  All other days 2.5mg       . aspirin 81 MG tablet Take 81 mg by mouth daily.      . metoprolol (LOPRESSOR) 50 MG tablet Take 1.5 tablets (75 mg total) by mouth daily.  45 tablet  1   No current facility-administered medications for this visit.    Past Medical History  Diagnosis Date  . History of mitral valve replacement with mechanical valve      St. Jude's bileaflet prosthesis  . S/P tricuspid valve replacement  bioprosthetic valve  . Chronic atrial fibrillation     currently normal sinus rhythm  . S/P patent foramen ovale closure   . Chronic anticoagulation   . Nephrolithiasis   . Irritable bowel syndrome     constipation  . Atypical chest pain     Negative catheterization 2002, low risk Cardiolite study November 2011  . Diabetes mellitus   . Moderate aortic stenosis by prior echocardiogram      asymptomatic  . Depression   . LV dysfunction     echocardiogram March 2013 by her primary care physician ejection fraction 40-45%, moderate aortic stenosis, prosthetic valves poorly reported , small pericardial effusion.  . Intermittent claudication     NL ABIs, 12/2011    No past surgical history on file.  History   Social History  . Marital Status: Married    Spouse Name: N/A    Number of Children: N/A  . Years of  Education: N/A   Occupational History  . Not on file.   Social History Main Topics  . Smoking status: Never Smoker   . Smokeless tobacco: Never Used  . Alcohol Use: Not on file  . Drug Use: Not on file  . Sexually Active: Not on file   Other Topics Concern  . Not on file   Social History Narrative  . No narrative on file    No family history on file.  ROS: no nausea, vomiting; no fever, chills; no melena, hematochezia; no claudication  PHYSICAL EXAM: BP 102/72  Pulse 83  Ht 5\' 4"  (1.626 m)  Wt 154 lb 1.9 oz (69.908 kg)  BMI 26.44 kg/m2 GENERAL: 79 year old female, chronically ill-appearing; NAD HEENT: NCAT, PERRLA, EOMI; sclera clear; no xanthelasma NECK: palpable bilateral carotid pulses, no bruits; positive JVDon the right, 90 LUNGS: CTA bilaterally CARDIAC: RRR (S1, S2); 2-3/6 systolic ejection murmur at base; S1 click; no rubs or gallops ABDOMEN: soft, non-tender; intact BS EXTREMETIES: trace peripheral edema SKIN: warm/dry; no obvious rash/lesions MUSCULOSKELETAL: no joint deformity NEURO: no focal deficit; NL affect   EKG: reviewed and available in Electronic Records   ASSESSMENT & PLAN:  Exertional dyspnea We'll order a complete echocardiogram for reassessment of LVF, as well as integrity of both mechanical valves. We will also request most recent echocardiogram from [facility], in [place]. Patient reports having had 2 transthoracic echocardiograms, since undergoing TEE, by Dr. Andee Lineman, last October. She has not had any followup since her last echocardiogram, and would like to reestablish with [place] here in [place]. We'll also order a metabolic profile and a BNP level. Patient appears euvolemic by exam, and has lost 4 pounds since last OV.  Chronic atrial fibrillation Adequately rate controlled on current dose of Toprol. On chronic Coumadin anticoagulation, followed by primary M.D.  History of mitral valve replacement with mechanical  valve Will reassess integrity of MVR, by echocardiography  S/P tricuspid valve replacement Will reassess integrity of TVR, by echocardiography  LV dysfunction Normalized LVF (EF 55-60%), by TEE in October 2013. Negative cardiac catheterization in 2002, low risk Cardiolite study, November 2011.    Gene Rachael Zapanta, PAC  Addendum: We reviewed the results of most recent echocardiogram at [facility], performed on 08/13/2012, and reviewed by Dr. Winifred Olive. This suggested no significant change in comparison with the prior study. NL LVF (EF 55-60%), with NL wall motion; NL RVF; mildly dilated BAE; mild/moderate AR; mechanical MV and TV; and, small pericardial effusion.  We will proceed with my initial  recommendation for a repeat transthoracic echocardiogram, which at least now can serve to rule out any progression of her small pericardial effusion, in light of her complaint of progressive dyspnea.

## 2012-10-22 NOTE — Assessment & Plan Note (Addendum)
Will reassess integrity of MVR, by echocardiography

## 2012-10-22 NOTE — Assessment & Plan Note (Signed)
We'll order a complete echocardiogram for reassessment of LVF, as well as integrity of both mechanical valves. We will also request most recent echocardiogram from [facility], in [place]. Patient reports having had 2 transthoracic echocardiograms, since undergoing TEE, by Dr. Andee Lineman, last October. She has not had any followup since her last echocardiogram, and would like to reestablish with [place] here in [place].

## 2012-10-27 ENCOUNTER — Other Ambulatory Visit: Payer: Self-pay | Admitting: *Deleted

## 2012-10-27 DIAGNOSIS — I4891 Unspecified atrial fibrillation: Secondary | ICD-10-CM

## 2012-11-04 ENCOUNTER — Other Ambulatory Visit: Payer: Self-pay | Admitting: *Deleted

## 2012-11-04 ENCOUNTER — Other Ambulatory Visit (INDEPENDENT_AMBULATORY_CARE_PROVIDER_SITE_OTHER): Payer: Medicare Other

## 2012-11-04 DIAGNOSIS — I4891 Unspecified atrial fibrillation: Secondary | ICD-10-CM

## 2012-11-04 DIAGNOSIS — Z952 Presence of prosthetic heart valve: Secondary | ICD-10-CM

## 2012-11-04 DIAGNOSIS — I519 Heart disease, unspecified: Secondary | ICD-10-CM

## 2012-11-04 DIAGNOSIS — I482 Chronic atrial fibrillation, unspecified: Secondary | ICD-10-CM

## 2012-11-04 DIAGNOSIS — Z954 Presence of other heart-valve replacement: Secondary | ICD-10-CM

## 2012-11-13 ENCOUNTER — Telehealth: Payer: Self-pay | Admitting: *Deleted

## 2012-11-13 NOTE — Telephone Encounter (Signed)
Notes Recorded by Lesle Chris, LPN on 06/15/1094 at 10:15 AM Patient notified and verbalized understanding. Already has follow up scheduled with Dr. Darl Householder for 12/28/2012.  Notes Recorded by Lesle Chris, LPN on 0/09/5407 at 8:57 AM Left message to return call. Steward Drone - daughter)  Notes Recorded by Lesle Chris, LPN on 01/08/1913 at 11:14 AM Left message to return call.

## 2012-11-13 NOTE — Telephone Encounter (Signed)
Notes Recorded by Lesle Chris, LPN on 10/09/8839 at 10:13 AM Patient notified and verbalized understanding.   Notes Recorded by Lesle Chris, LPN on 11/13/628 at 8:56 AM Left message to return call. Steward Drone - daughter)  Notes Recorded by Lesle Chris, LPN on 06/16/107 at 11:15 AM Left message to return call.

## 2012-11-13 NOTE — Telephone Encounter (Signed)
Message copied by Lesle Chris on Fri Nov 13, 2012 10:14 AM ------      Message from: Elizabeth Wallace      Created: Wed Nov 04, 2012  5:02 PM       Stable labs. Echo results pending. Review at f/u OV. ------

## 2012-11-13 NOTE — Telephone Encounter (Signed)
Message copied by Lesle Chris on Fri Nov 13, 2012 10:16 AM ------      Message from: Rande Brunt      Created: Fri Nov 06, 2012 12:39 PM       NL LVF, but moderately reduced RVF; stable mechanical MV; at least moderate AS; small pericardial effusion. Recommend repeat echo in 4 months to f/u pericardial effusion. ------

## 2012-11-25 DIAGNOSIS — N1330 Unspecified hydronephrosis: Secondary | ICD-10-CM | POA: Insufficient documentation

## 2012-12-04 ENCOUNTER — Ambulatory Visit: Payer: Medicare Other | Admitting: Cardiology

## 2012-12-08 ENCOUNTER — Ambulatory Visit: Payer: Medicare Other | Admitting: Cardiovascular Disease

## 2012-12-15 ENCOUNTER — Telehealth: Payer: Self-pay | Admitting: Cardiology

## 2012-12-15 MED ORDER — FUROSEMIDE 40 MG PO TABS: 20.0000 mg | ORAL_TABLET | Freq: Every day | ORAL | Status: DC

## 2012-12-15 NOTE — Telephone Encounter (Signed)
Pt called wanting refill sent to [facility] for Lasix 40 mg 1/2 tablet daily.

## 2012-12-28 ENCOUNTER — Encounter: Payer: Self-pay | Admitting: Cardiovascular Disease

## 2012-12-28 ENCOUNTER — Ambulatory Visit (INDEPENDENT_AMBULATORY_CARE_PROVIDER_SITE_OTHER): Payer: Medicare Other | Admitting: Cardiovascular Disease

## 2012-12-28 VITALS — BP 106/66 | HR 66 | Ht 62.0 in | Wt 150.1 lb

## 2012-12-28 DIAGNOSIS — I359 Nonrheumatic aortic valve disorder, unspecified: Secondary | ICD-10-CM

## 2012-12-28 DIAGNOSIS — I350 Nonrheumatic aortic (valve) stenosis: Secondary | ICD-10-CM

## 2012-12-28 DIAGNOSIS — Z952 Presence of prosthetic heart valve: Secondary | ICD-10-CM

## 2012-12-28 DIAGNOSIS — I872 Venous insufficiency (chronic) (peripheral): Secondary | ICD-10-CM

## 2012-12-28 DIAGNOSIS — I4891 Unspecified atrial fibrillation: Secondary | ICD-10-CM

## 2012-12-28 DIAGNOSIS — I482 Chronic atrial fibrillation, unspecified: Secondary | ICD-10-CM

## 2012-12-28 DIAGNOSIS — Z954 Presence of other heart-valve replacement: Secondary | ICD-10-CM

## 2012-12-28 NOTE — Patient Instructions (Addendum)

## 2012-12-28 NOTE — Progress Notes (Signed)
Patient ID: Elizabeth Wallace, female   DOB: 07/27/1933, 79 y.o.   MRN: [id number]    SUBJECTIVE: Elizabeth Wallace has a cardiac history which is notable for permanent atrial fibrillation and valvular heart disease, status post mechanical MVR and bioprosthetic TVR. She also had history of mild LVD (EF 40-45%), but with NL LVF, by most recent assessment in May 2014. She also has history of atypical CP with a negative catheterization in 2002, and a low risk Cardiolite study in November 2011. She has chronic peripheral edema.  She denies chest pain and shortness of breath. She used to have SOB in the past but this has resolved.    BP 106/66  Pulse 66    PHYSICAL EXAM General: NAD Neck: No JVD, no thyromegaly or thyroid nodule.  Lungs: Clear to auscultation bilaterally with normal respiratory effort. CV: Nondisplaced PMI.  Heart regular S1/S2, no S3/S4, no murmur.  1+ lower extremity edema.  No carotid bruit.  Normal pedal pulses.  Abdomen: Soft, nontender, no hepatosplenomegaly, no distention.  Neurologic: Alert and oriented x 3.  Psych: Normal affect. Extremities: No clubbing or cyanosis.    LABS: Basic Metabolic Panel: No results found for this basename: NA, K, CL, CO2, GLUCOSE, BUN, CREATININE, CALCIUM, MG, PHOS,  in the last 72 hours Liver Function Tests: No results found for this basename: AST, ALT, ALKPHOS, BILITOT, PROT, ALBUMIN,  in the last 72 hours No results found for this basename: LIPASE, AMYLASE,  in the last 72 hours CBC: No results found for this basename: WBC, NEUTROABS, HGB, HCT, MCV, PLT,  in the last 72 hours Cardiac Enzymes: No results found for this basename: CKTOTAL, CKMB, CKMBINDEX, TROPONINI,  in the last 72 hours BNP: No components found with this basename: POCBNP,  D-Dimer: No results found for this basename: DDIMER,  in the last 72 hours Hemoglobin A1C: No results found for this basename: HGBA1C,  in the last 72 hours Fasting Lipid Panel: No results found for  this basename: CHOL, HDL, LDLCALC, TRIG, CHOLHDL, LDLDIRECT,  in the last 72 hours Thyroid Function Tests: No results found for this basename: TSH, T4TOTAL, FREET3, T3FREE, THYROIDAB,  in the last 72 hours Anemia Panel: No results found for this basename: VITAMINB12, FOLATE, FERRITIN, TIBC, IRON, RETICCTPCT,  in the last 72 hours   Echo (11-04-2012) - Left ventricle: The cavity size was normal. Wall thickness was normal. Systolic function was normal. The estimated ejection fraction was in the range of 55% to 60%. Wall motion was normal; there were no regional wall motion abnormalities. The study is not technically sufficient to allow evaluation of LV diastolic function. - Ventricular septum: The contour showed diastolic flattening and systolic flattening. - Aortic valve: Probably trileaflet; mildly thickened, moderately calcified leaflets. Cusp separation was reduced. There was at least moderate stenosis - not well evaluated. Mild regurgitation. Mean gradient: 18mm Hg (S). Peak gradient: 32mm Hg (S). Peak velocity ratio of LVOT to aortic valve: 0.23. - Mitral valve: A mechanical prosthesis was present - 25 mm St. Jude. No obvious paravalvular leak. Appears stable in position. Trivial regurgitation. - Left atrium: The atrium was mildly to moderately dilated. - Right ventricle: The cavity size was mildly dilated. Systolic function was mildly to moderately reduced. - Right atrium: The atrium was mildly dilated. - Tricuspid valve: A mechanical prosthesis was present - 33 mm St. Jude. Very poorly visualized. No gradients obtained. - Pulmonary arteries: Systolic pressure could not be accurately estimated. - Inferior vena cava: The vessel was dilated.  Unable to accurately assess CVP, but suspect at least 8 mmHg based on dilated IVC. - Pericardium, extracardiac: A prominent pericardial fat pad was present. A small pericardial effusion was identified circumferential to the  heart.     ASSESSMENT AND PLAN: 1. Chronic atrial fibrillation: rate controlled, on Warfarin. 2. S/p MVR and TVR: stable as per recent echo. 3. LV dysfunction: resolved, evidenced by echocardiogram in May 2014. 4. Lower extremity swelling: has venous insufficiency, recommended continued use of support hose and leg elevation with pillows while sleeping. 5. Aortic stenosis: moderate by last echo, continue to monitor by physical exam and serial echoes.  Prentice Docker, M.D., F.A.C.C.

## 2013-04-20 ENCOUNTER — Ambulatory Visit (INDEPENDENT_AMBULATORY_CARE_PROVIDER_SITE_OTHER): Payer: Medicare Other | Admitting: Cardiovascular Disease

## 2013-04-20 ENCOUNTER — Encounter: Payer: Self-pay | Admitting: Cardiovascular Disease

## 2013-04-20 VITALS — BP 130/75 | HR 60 | Ht 62.0 in | Wt 151.0 lb

## 2013-04-20 DIAGNOSIS — R5381 Other malaise: Secondary | ICD-10-CM

## 2013-04-20 DIAGNOSIS — F329 Major depressive disorder, single episode, unspecified: Secondary | ICD-10-CM

## 2013-04-20 DIAGNOSIS — I350 Nonrheumatic aortic (valve) stenosis: Secondary | ICD-10-CM

## 2013-04-20 DIAGNOSIS — Z7901 Long term (current) use of anticoagulants: Secondary | ICD-10-CM

## 2013-04-20 DIAGNOSIS — Z952 Presence of prosthetic heart valve: Secondary | ICD-10-CM

## 2013-04-20 DIAGNOSIS — Z954 Presence of other heart-valve replacement: Secondary | ICD-10-CM

## 2013-04-20 DIAGNOSIS — I359 Nonrheumatic aortic valve disorder, unspecified: Secondary | ICD-10-CM

## 2013-04-20 DIAGNOSIS — I4891 Unspecified atrial fibrillation: Secondary | ICD-10-CM

## 2013-04-20 DIAGNOSIS — R0609 Other forms of dyspnea: Secondary | ICD-10-CM

## 2013-04-20 DIAGNOSIS — R5383 Other fatigue: Secondary | ICD-10-CM

## 2013-04-20 DIAGNOSIS — I872 Venous insufficiency (chronic) (peripheral): Secondary | ICD-10-CM

## 2013-04-20 DIAGNOSIS — I482 Chronic atrial fibrillation, unspecified: Secondary | ICD-10-CM

## 2013-04-20 MED ORDER — METOPROLOL SUCCINATE ER 50 MG PO TB24: 50.0000 mg | ORAL_TABLET | Freq: Every day | ORAL | Status: DC

## 2013-04-20 NOTE — Patient Instructions (Signed)
Your physician has requested that you have an echocardiogram. Echocardiography is a painless test that uses sound waves to create images of your heart. It provides your doctor with information about the size and shape of your heart and how well your heart's chambers and valves are working. This procedure takes approximately one hour. There are no restrictions for this procedure. Office will contact with results via phone or letter.   Stop Metoprolol Tartrate - short acting Change to long acting - Metoprolol Succ (Toprol XL) 50mg  daily - new sent to pharm  Continue all other medications.   Follow up in  4-6 weeks

## 2013-04-20 NOTE — Progress Notes (Signed)
Patient ID: Elizabeth Wallace, female   DOB: 01/21/1934, 79 y.o.   MRN: [id number]      SUBJECTIVE: Mrs. Holle has a cardiac history which is notable for permanent atrial fibrillation and valvular heart disease, status post mechanical MVR and bioprosthetic TVR. She also had a history of mild LVD (EF 40-45%), but with NL LV systolic function by most recent assessment in May 2014.  She also has history of atypical CP with a negative catheterization in 2002, and a low risk Cardiolite study in November 2011. She has chronic peripheral edema.  Since her last visit, she has developed shortness of breath and fatigue over the past 2 months. When she mops or vacuums, she gets tired out very easily. She denies PND and has used 2 pillows to sleep for a very long time.  She denies chest pain per se and hasnt' had any significant leg swelling from her baseline. She denies lightheadedness, dizziness, and syncope.  She says she's under a lot of stress as well, due to her son having a scooter accident. She also cares for her disabled husband.     Allergies  Allergen Reactions  . Oxycodone-Acetaminophen     REACTION: gi upset    Current Outpatient Prescriptions  Medication Sig Dispense Refill  . ALPRAZolam (XANAX) 0.5 MG tablet Take 0.5 mg by mouth 3 (three) times daily.       Marland Kitchen dexlansoprazole (DEXILANT) 60 MG capsule Take 60 mg by mouth daily.      . furosemide (LASIX) 40 MG tablet Take 40 mg by mouth daily.      Marland Kitchen glipiZIDE (GLUCOTROL XL) 2.5 MG 24 hr tablet Take 2.5 mg by mouth daily.      Marland Kitchen levocetirizine (XYZAL) 5 MG tablet Take 1 tablet by mouth daily.      . metoprolol (LOPRESSOR) 50 MG tablet Take 1.5 tablets by mouth daily.      Marland Kitchen olopatadine (PATANOL) 0.1 % ophthalmic solution Place 1 drop into both eyes 2 (two) times daily.      . polyethylene glycol (MIRALAX / GLYCOLAX) packet Take 17 g by mouth daily.       . potassium chloride (KLOR-CON) 10 MEQ CR tablet Take 20 mEq by mouth daily.         . raloxifene (EVISTA) 60 MG tablet Take 60 mg by mouth daily.        . simvastatin (ZOCOR) 40 MG tablet Take 40 mg by mouth at bedtime.        . sucralfate (CARAFATE) 1 GM/10ML suspension Take 1 g by mouth 3 (three) times daily as needed.        . traMADol-acetaminophen (ULTRACET) 37.5-325 MG per tablet Take 2 tablets by mouth 3 (three) times daily.       Marland Kitchen triamcinolone cream (KENALOG) 0.1 % Apply 1 application topically 2 (two) times daily.      Marland Kitchen warfarin (COUMADIN) 5 MG tablet Take 5 mg by mouth daily. 5mg  T, W, Th, Saturday  All other days 2.5mg        No current facility-administered medications for this visit.    Past Medical History  Diagnosis Date  . History of mitral valve replacement with mechanical valve      St. Jude's bileaflet prosthesis  . S/P tricuspid valve replacement      bioprosthetic valve  . Chronic atrial fibrillation     currently normal sinus rhythm  . S/P patent foramen ovale closure   . Chronic anticoagulation   .  Nephrolithiasis   . Irritable bowel syndrome     constipation  . Atypical chest pain     Negative catheterization 2002, low risk Cardiolite study November 2011  . Diabetes mellitus   . Moderate aortic stenosis by prior echocardiogram      asymptomatic  . Depression   . LV dysfunction     echocardiogram March 2013 by her primary care physician ejection fraction 40-45%, moderate aortic stenosis, prosthetic valves poorly reported , small pericardial effusion.  . Intermittent claudication     NL ABIs, 12/2011    No past surgical history on file.  History   Social History  . Marital Status: Married    Spouse Name: N/A    Number of Children: N/A  . Years of Education: N/A   Occupational History  . Not on file.   Social History Main Topics  . Smoking status: Never Smoker   . Smokeless tobacco: Never Used  . Alcohol Use: Not on file  . Drug Use: Not on file  . Sexual Activity: Not on file   Other Topics Concern  . Not on file    Social History Narrative  . No narrative on file     Filed Vitals:   04/20/13 0941  BP: 130/75  Pulse: 60  Height: 5\' 2"  (1.575 m)  Weight: 151 lb (68.493 kg)  SpO2: 97%    PHYSICAL EXAM General: NAD, flat affect Neck: No JVD, no thyromegaly or thyroid nodule.  Lungs: Clear to auscultation bilaterally with normal respiratory effort. CV: Nondisplaced PMI.  Heart regular S1 click/S2, no S3/S4, II/VI mid-peaking systolic murmur loudest at RUSB.  Trace peripheral edema and wearing compression stockings.  No carotid bruit.  Normal pedal pulses.  Abdomen: Soft, nontender, no hepatosplenomegaly, no distention.  Neurologic: Alert and oriented x 3.  Psych: Normal affect. Extremities: No clubbing or cyanosis.   ECG: reviewed and available in electronic records.      ASSESSMENT AND PLAN: 1. Chronic atrial fibrillation: rate controlled, on Warfarin. Given her fatigue, will switch metoprolol to Toprol-XL 50 mg daily. 2. S/p MVR and TVR: stable as per recent echo.  3. LV dysfunction: resolved, evidenced by echocardiogram in May 2014.  4. Lower extremity swelling: has venous insufficiency, recommended continued use of support hose and leg elevation with pillows while sleeping.  5. Aortic stenosis: moderate by last echo, but report stated "there was at least moderate stenosis - not well evaluated. Mild regurgitation. Mean gradient: 18mm Hg (S). Peak gradient: 32mm Hg (S). Peak velocity ratio of LVOT to aortic valve: 0.23." I will repeat an echocardiogram to attempt a better evaluation of her aortic stenosis and to see if there has been interval progression. S2 was appreciated which is somewhat reassuring. 6. Shortness of breath and fatigue: switch metoprolol tartrate to succinate, 50 mg daily. Evaluate for progression of aortic stenosis. Her fatigue may also be due to depression, given her social stressors with regards to her son and disabled husband.     Prentice Docker, M.D.,  F.A.C.C.

## 2013-04-21 ENCOUNTER — Other Ambulatory Visit: Payer: Self-pay

## 2013-04-21 ENCOUNTER — Other Ambulatory Visit (INDEPENDENT_AMBULATORY_CARE_PROVIDER_SITE_OTHER): Payer: Medicare Other

## 2013-04-21 DIAGNOSIS — I369 Nonrheumatic tricuspid valve disorder, unspecified: Secondary | ICD-10-CM

## 2013-04-21 DIAGNOSIS — I059 Rheumatic mitral valve disease, unspecified: Secondary | ICD-10-CM

## 2013-04-21 DIAGNOSIS — I359 Nonrheumatic aortic valve disorder, unspecified: Secondary | ICD-10-CM

## 2013-04-21 DIAGNOSIS — I350 Nonrheumatic aortic (valve) stenosis: Secondary | ICD-10-CM

## 2013-04-22 ENCOUNTER — Encounter: Payer: Self-pay | Admitting: *Deleted

## 2013-05-04 ENCOUNTER — Encounter: Payer: Self-pay | Admitting: Cardiovascular Disease

## 2013-05-10 ENCOUNTER — Ambulatory Visit (INDEPENDENT_AMBULATORY_CARE_PROVIDER_SITE_OTHER): Payer: Medicare Other | Admitting: Cardiovascular Disease

## 2013-05-10 ENCOUNTER — Encounter: Payer: Self-pay | Admitting: Cardiovascular Disease

## 2013-05-10 VITALS — BP 98/64 | HR 76 | Ht 62.0 in | Wt 149.0 lb

## 2013-05-10 DIAGNOSIS — R002 Palpitations: Secondary | ICD-10-CM

## 2013-05-10 DIAGNOSIS — I4891 Unspecified atrial fibrillation: Secondary | ICD-10-CM

## 2013-05-10 DIAGNOSIS — I872 Venous insufficiency (chronic) (peripheral): Secondary | ICD-10-CM

## 2013-05-10 DIAGNOSIS — I359 Nonrheumatic aortic valve disorder, unspecified: Secondary | ICD-10-CM

## 2013-05-10 DIAGNOSIS — R0789 Other chest pain: Secondary | ICD-10-CM

## 2013-05-10 DIAGNOSIS — I482 Chronic atrial fibrillation, unspecified: Secondary | ICD-10-CM

## 2013-05-10 DIAGNOSIS — Z954 Presence of other heart-valve replacement: Secondary | ICD-10-CM

## 2013-05-10 DIAGNOSIS — I350 Nonrheumatic aortic (valve) stenosis: Secondary | ICD-10-CM

## 2013-05-10 DIAGNOSIS — Z952 Presence of prosthetic heart valve: Secondary | ICD-10-CM

## 2013-05-10 DIAGNOSIS — Z7901 Long term (current) use of anticoagulants: Secondary | ICD-10-CM

## 2013-05-10 DIAGNOSIS — R5383 Other fatigue: Secondary | ICD-10-CM

## 2013-05-10 DIAGNOSIS — R5381 Other malaise: Secondary | ICD-10-CM

## 2013-05-10 DIAGNOSIS — I519 Heart disease, unspecified: Secondary | ICD-10-CM

## 2013-05-10 NOTE — Progress Notes (Signed)
Patient ID: Elizabeth Wallace, female   DOB: Dec 12, 1933, 79 y.o.   MRN: [id number]      SUBJECTIVE: Elizabeth Wallace has a cardiac history which is notable for permanent atrial fibrillation and valvular heart disease, status post mechanical MVR and bioprosthetic TVR. She also had a history of mild LVD (EF 40-45%), but with NL LV systolic function by most recent assessment in May 2014.  She also has history of atypical CP with a negative catheterization in 2002, and a low risk Cardiolite study in November 2011. She has chronic peripheral edema.   At her last visit, she complained of the development of shortness of breath and fatigue over the preceding 2 months. When she mopped or vacuumed, she gets tired out very easily.   Her echo showed normal LV systolic function, moderate AS and AI, and normally functioning prosthetic mitral and tricuspid valves.  Since switching her metoprolol to Toprol-XL 50 mg daily, her energy levels seem to have improved. She had palpitations once and took an extra half-tablet which caused her symptoms to subside. She had some left sided chest cramps this morning which spontaneously resolved. She didn't sleep at night.  She cares for her disabled husband.     Allergies  Allergen Reactions  . Oxycodone-Acetaminophen     REACTION: gi upset    Current Outpatient Prescriptions  Medication Sig Dispense Refill  . ALPRAZolam (XANAX) 0.5 MG tablet Take 0.5 mg by mouth 3 (three) times daily.       Marland Kitchen dexlansoprazole (DEXILANT) 60 MG capsule Take 60 mg by mouth daily.      . furosemide (LASIX) 40 MG tablet Take 20 mg by mouth daily.       Marland Kitchen glipiZIDE (GLUCOTROL XL) 2.5 MG 24 hr tablet Take 2.5 mg by mouth daily.      Marland Kitchen levocetirizine (XYZAL) 5 MG tablet Take 1 tablet by mouth daily.      . metoprolol succinate (TOPROL-XL) 50 MG 24 hr tablet Take 1 tablet (50 mg total) by mouth daily.  30 tablet  6  . olopatadine (PATANOL) 0.1 % ophthalmic solution Place 1 drop into both eyes  2 (two) times daily.      . polyethylene glycol (MIRALAX / GLYCOLAX) packet Take 17 g by mouth daily.       . potassium chloride (KLOR-CON) 10 MEQ CR tablet Take 20 mEq by mouth daily.       . raloxifene (EVISTA) 60 MG tablet Take 60 mg by mouth daily.        . simvastatin (ZOCOR) 40 MG tablet Take 40 mg by mouth at bedtime.        . sucralfate (CARAFATE) 1 GM/10ML suspension Take 1 g by mouth 3 (three) times daily as needed.        . traMADol-acetaminophen (ULTRACET) 37.5-325 MG per tablet Take 2 tablets by mouth 3 (three) times daily.       Marland Kitchen triamcinolone cream (KENALOG) 0.1 % Apply 1 application topically 2 (two) times daily.      Marland Kitchen warfarin (COUMADIN) 5 MG tablet Take 5 mg by mouth daily. 5mg  T, W, Th, Saturday  All other days 2.5mg       . VOLTAREN 1 % GEL Apply 1 application topically as needed.       No current facility-administered medications for this visit.    Past Medical History  Diagnosis Date  . History of mitral valve replacement with mechanical valve      St. Jude's bileaflet prosthesis  .  S/P tricuspid valve replacement      bioprosthetic valve  . Chronic atrial fibrillation     currently normal sinus rhythm  . S/P patent foramen ovale closure   . Chronic anticoagulation   . Nephrolithiasis   . Irritable bowel syndrome     constipation  . Atypical chest pain     Negative catheterization 2002, low risk Cardiolite study November 2011  . Diabetes mellitus   . Moderate aortic stenosis by prior echocardiogram      asymptomatic  . Depression   . LV dysfunction     echocardiogram March 2013 by her primary care physician ejection fraction 40-45%, moderate aortic stenosis, prosthetic valves poorly reported , small pericardial effusion.  . Intermittent claudication     NL ABIs, 12/2011    No past surgical history on file.  History   Social History  . Marital Status: Married    Spouse Name: N/A    Number of Children: N/A  . Years of Education: N/A   Occupational  History  . Not on file.   Social History Main Topics  . Smoking status: Never Smoker   . Smokeless tobacco: Never Used  . Alcohol Use: Not on file  . Drug Use: Not on file  . Sexual Activity: Not on file   Other Topics Concern  . Not on file   Social History Narrative  . No narrative on file     Filed Vitals:   05/10/13 1023  BP: 98/64  Pulse: 76  Height: 5\' 2"  (1.575 m)  Weight: 149 lb (67.586 kg)    PHYSICAL EXAM General: NAD, flat affect  Neck: No JVD, no thyromegaly or thyroid nodule.  Lungs: Clear to auscultation bilaterally with normal respiratory effort.  CV: Nondisplaced PMI. Heart irregular, S1 click/S2, no S3/S4, II/VI mid-peaking systolic murmur loudest at RUSB. Minimal peripheral edema and wearing compression stockings. No carotid bruit. Normal pedal pulses.  Abdomen: Soft, nontender, no hepatosplenomegaly, no distention.  Neurologic: Alert and oriented x 3.  Psych: Normal affect.  Extremities: No clubbing or cyanosis. Stasis dermatitis.   ECG: reviewed and available in electronic records.  ECHO (04-21-2013): - Left ventricle: Mild basal septal hypertrophy. The cavity size was normal. Wall thickness was increased in a pattern of mild LVH. Systolic function was normal. The estimated ejection fraction was in the range of 60% to 65%. Wall motion was normal; there were no regional wall motion abnormalities. The study is not technically sufficient to allow evaluation of LV diastolic function, due to underlying atrial fibrillation. - Ventricular septum: Septal motion showed paradox. The contour showed systolic flattening, indicative of increased pressure. These changes are consistent with a post-thoracotomy state. - Aortic valve: Mildly to moderately calcified annulus. Trileaflet; mildly thickened, moderately calcified leaflets. Transvalvular velocity was increased: there is likely moderate aortic stenosis. Moderate regurgitation. Mean gradient:69mm Hg  (S). Peak transvalvular velocity 3.1 cm/s. Valve area: 1.07cm^2(VTI). Valve area: 1.14cm^2 (Vmax). - Mitral valve: A mechanical prosthesis was present-25 mm St. Jude. No paravalvular leak was noted. The valve itself was well seated. Valve area by continuity equation (using LVOT flow): 2.53cm^2. - Left atrium: The atrium was mildly dilated. - Right ventricle: Systolic function was low normal. - Tricuspid valve: A mechanical prosthesis was present-33 mm St. Jude. It was poorly visualized. No gradients were obtained. - Pulmonic valve: Mild regurgitation. - Inferior vena cava: The vessel was dilated, but appeared to collapse >50% with respiration, suggestive of an RAP of 8 mmHg.  ASSESSMENT AND PLAN: 1. Chronic atrial fibrillation: rate controlled, on Warfarin. Continue Toprol-XL 50 mg daily.  2. S/p MVR and TVR: stable as per recent echo.  3. LV dysfunction: resolved, evidenced by echocardiogram in November 2014.  4. Lower extremity swelling: has venous insufficiency, recommended continued use of support hose and leg elevation with pillows while sleeping.  5. Aortic stenosis: moderate by echo in 04/2013, with moderate AI. 6. Shortness of breath and fatigue:  Energy levels improved with switch to Toprol-XL 50 mg daily. Her fatigue may also be due to depression, given her social stressors with regards to her son and disabled husband.    Prentice Docker, M.D., F.A.C.C.

## 2013-05-10 NOTE — Patient Instructions (Signed)
Continue all current medications. Your physician wants you to follow up in: 6 months.  You will receive a reminder letter in the mail one-two months in advance.  If you don't receive a letter, please call our office to schedule the follow up appointment   

## 2013-06-09 ENCOUNTER — Other Ambulatory Visit: Payer: Self-pay | Admitting: Physician Assistant

## 2013-07-07 ENCOUNTER — Other Ambulatory Visit: Payer: Self-pay | Admitting: Cardiology

## 2013-07-07 MED ORDER — FUROSEMIDE 40 MG PO TABS: ORAL_TABLET | ORAL | Status: DC

## 2013-07-26 ENCOUNTER — Telehealth: Payer: Self-pay | Admitting: Cardiology

## 2013-07-26 NOTE — Telephone Encounter (Signed)
That would be fine 

## 2013-07-26 NOTE — Telephone Encounter (Signed)
Pt called c/o shortness of breath, and her heart acting up with palpitations. Pt c/o of no chest pain, no dizziness. Pt wants to know if it is ok to take an extra dose of her toprol to see if this will help her palpitations and shortness of breath. Pt stated that previous cardiologist said that an extra 25 MG dose of toprol should help when she has palpitations and shortness of breath. Please advise.

## 2013-07-26 NOTE — Telephone Encounter (Signed)
Pt informed that and extra 25 MG dose of Toprol XL would be ok. I informed pt that our office would be closed Tuesday 07-27-13 and 1/2 day on Wednesday 07-28-13. If issues got worse to call 911 or go to ER. Pt verbalized understanding.

## 2013-09-15 DIAGNOSIS — N3020 Other chronic cystitis without hematuria: Secondary | ICD-10-CM | POA: Insufficient documentation

## 2013-09-15 DIAGNOSIS — N261 Atrophy of kidney (terminal): Secondary | ICD-10-CM | POA: Insufficient documentation

## 2013-11-03 ENCOUNTER — Encounter: Payer: Self-pay | Admitting: *Deleted

## 2013-11-03 ENCOUNTER — Ambulatory Visit (INDEPENDENT_AMBULATORY_CARE_PROVIDER_SITE_OTHER): Payer: Medicare Other | Admitting: Cardiovascular Disease

## 2013-11-03 ENCOUNTER — Encounter: Payer: Self-pay | Admitting: Cardiovascular Disease

## 2013-11-03 VITALS — BP 120/72 | HR 83 | Ht 64.0 in | Wt 152.0 lb

## 2013-11-03 DIAGNOSIS — I482 Chronic atrial fibrillation, unspecified: Secondary | ICD-10-CM

## 2013-11-03 DIAGNOSIS — R252 Cramp and spasm: Secondary | ICD-10-CM

## 2013-11-03 DIAGNOSIS — R5383 Other fatigue: Secondary | ICD-10-CM

## 2013-11-03 DIAGNOSIS — I4891 Unspecified atrial fibrillation: Secondary | ICD-10-CM

## 2013-11-03 DIAGNOSIS — I350 Nonrheumatic aortic (valve) stenosis: Secondary | ICD-10-CM

## 2013-11-03 DIAGNOSIS — I872 Venous insufficiency (chronic) (peripheral): Secondary | ICD-10-CM

## 2013-11-03 DIAGNOSIS — Z952 Presence of prosthetic heart valve: Secondary | ICD-10-CM

## 2013-11-03 DIAGNOSIS — R5381 Other malaise: Secondary | ICD-10-CM

## 2013-11-03 DIAGNOSIS — I8390 Asymptomatic varicose veins of unspecified lower extremity: Secondary | ICD-10-CM

## 2013-11-03 DIAGNOSIS — Z7901 Long term (current) use of anticoagulants: Secondary | ICD-10-CM

## 2013-11-03 DIAGNOSIS — I359 Nonrheumatic aortic valve disorder, unspecified: Secondary | ICD-10-CM

## 2013-11-03 DIAGNOSIS — R0602 Shortness of breath: Secondary | ICD-10-CM

## 2013-11-03 DIAGNOSIS — R9431 Abnormal electrocardiogram [ECG] [EKG]: Secondary | ICD-10-CM

## 2013-11-03 DIAGNOSIS — Z954 Presence of other heart-valve replacement: Secondary | ICD-10-CM

## 2013-11-03 NOTE — Progress Notes (Signed)
Patient ID: Elizabeth Wallace, female   DOB: 20-Oct-1933, 80 y.o.   MRN: [id number]      SUBJECTIVE: Elizabeth Wallace has a cardiac history which is notable for permanent atrial fibrillation and valvular heart disease, status post mechanical MVR and bioprosthetic TVR. She also has history of atypical CP with a negative catheterization in 2002, and a low risk Cardiolite study in November 2011. She has chronic peripheral edema with venous varicosities. She also has depression, and takes care of her disabled husband.  Her echo in 04/2013 demonstrated normal LV systolic function, moderate AS and AI, and normally functioning prosthetic mitral and tricuspid valves.  She has been feeling more fatigued lately. She has also had some mild increase of her dyspnea on exertion, which is typically relieved with Lasix. Her venous there cost these have become very bothersome for her, particularly in her left leg, ankle, and foot. She denies chest pain. ECG today demonstrates atrial fibrillation and flutter, heart rate 70 beats per minute. There is a nonspecific ST-T abnormality, particularly in the inferior leads and V4 through V6.   Allergies  Allergen Reactions  . Oxycodone-Acetaminophen     REACTION: gi upset    Current Outpatient Prescriptions  Medication Sig Dispense Refill  . ALPRAZolam (XANAX) 0.5 MG tablet Take 0.5 mg by mouth 3 (three) times daily.       Marland Kitchen dexlansoprazole (DEXILANT) 60 MG capsule Take 60 mg by mouth daily.      . furosemide (LASIX) 40 MG tablet TAKE (1/2) TABLET BY MOUTH DAILY.  15 tablet  6  . glipiZIDE (GLUCOTROL XL) 2.5 MG 24 hr tablet Take 2.5 mg by mouth daily.      Marland Kitchen levocetirizine (XYZAL) 5 MG tablet Take 1 tablet by mouth daily.      . metoprolol succinate (TOPROL-XL) 50 MG 24 hr tablet Take 1 tablet (50 mg total) by mouth daily.  30 tablet  6  . olopatadine (PATANOL) 0.1 % ophthalmic solution Place 1 drop into both eyes 2 (two) times daily.      . polyethylene glycol (MIRALAX /  GLYCOLAX) packet Take 17 g by mouth daily.       . potassium chloride (KLOR-CON) 10 MEQ CR tablet Take 20 mEq by mouth daily.       . raloxifene (EVISTA) 60 MG tablet Take 60 mg by mouth daily.        . simvastatin (ZOCOR) 40 MG tablet Take 40 mg by mouth at bedtime.        . sucralfate (CARAFATE) 1 GM/10ML suspension Take 1 g by mouth 3 (three) times daily as needed.        . traMADol-acetaminophen (ULTRACET) 37.5-325 MG per tablet Take 2 tablets by mouth 3 (three) times daily.       Marland Kitchen triamcinolone cream (KENALOG) 0.1 % Apply 1 application topically 2 (two) times daily.      . VOLTAREN 1 % GEL Apply 1 application topically as needed.      . warfarin (COUMADIN) 5 MG tablet Take 5 mg by mouth daily. 5mg  T, W, Th, Saturday  All other days 2.5mg        No current facility-administered medications for this visit.    Past Medical History  Diagnosis Date  . History of mitral valve replacement with mechanical valve      St. Jude's bileaflet prosthesis  . S/P tricuspid valve replacement      bioprosthetic valve  . Chronic atrial fibrillation     currently normal  sinus rhythm  . S/P patent foramen ovale closure   . Chronic anticoagulation   . Nephrolithiasis   . Irritable bowel syndrome     constipation  . Atypical chest pain     Negative catheterization 2002, low risk Cardiolite study November 2011  . Diabetes mellitus   . Moderate aortic stenosis by prior echocardiogram      asymptomatic  . Depression   . LV dysfunction     echocardiogram March 2013 by her primary care physician ejection fraction 40-45%, moderate aortic stenosis, prosthetic valves poorly reported , small pericardial effusion.  . Intermittent claudication     NL ABIs, 12/2011    No past surgical history on file.  History   Social History  . Marital Status: Married    Spouse Name: N/A    Number of Children: N/A  . Years of Education: N/A   Occupational History  . Not on file.   Social History Main Topics  .  Smoking status: Never Smoker   . Smokeless tobacco: Never Used  . Alcohol Use: Not on file  . Drug Use: Not on file  . Sexual Activity: Not on file   Other Topics Concern  . Not on file   Social History Narrative  . No narrative on file    BP 120/72  Pulse 83    PHYSICAL EXAM General: NAD, flat affect  Neck: No JVD, no thyromegaly or thyroid nodule.  Lungs: Clear to auscultation bilaterally with normal respiratory effort.  CV: Nondisplaced PMI. Mostly regular with some mild irregularlity, S1 click/S2, no S3, II/VI mid-peaking systolic murmur loudest at RUSB. Minimal peripheral edema with b/l venous varicosities.  Abdomen: Soft, nontender, no hepatosplenomegaly, no distention.  Neurologic: Alert and oriented x 3.  Psych: Flat affect, tearful. Extremities: No clubbing or cyanosis. Stasis dermatitis.   ECG: reviewed and available in electronic records.    ASSESSMENT AND PLAN: 1. Permanent atrial fibrillation: Rate controlled, on warfarin. Continue Toprol-XL 50 mg daily.  2. S/p MVR and TVR: Stable as per 04/2013 echo.  3. LV dysfunction: Resolved, evidenced by echocardiogram in November 2014.  4. Lower extremity swelling: She has venous insufficiency, and I recommend continued use of support hose and leg elevation with pillows while sleeping. As they have become more bothersome for her, I will make a referral to vascular surgery. 5. Aortic stenosis: Moderate by echo in 04/2013, with moderate AI.  6. Shortness of breath and fatigue: Energy levels have diminished. Her fatigue may be due to depression, given her social stressors with regards to her son and disabled husband. However, she has some nonspecific ST segment changes and I want to rule out occult ischemic heart disease. I will obtain a Lexiscan Cardiolite stress test, along with a BMET, magnesium, and CBC.  Dispo: f/u 6 weeks.  Kate Sable, M.D., F.A.C.C.

## 2013-11-03 NOTE — Patient Instructions (Signed)
   Labs for BMET, CBC, Magnesium Your physician has requested that you have a lexiscan myoview. For further information please visit [email or web address]. Please follow instruction sheet, as given. Office will contact with results via phone or letter.   Continue all current medications. Referral to vascular  Follow up in  6 weeks

## 2013-11-10 ENCOUNTER — Other Ambulatory Visit: Payer: Self-pay | Admitting: *Deleted

## 2013-11-10 DIAGNOSIS — M7989 Other specified soft tissue disorders: Secondary | ICD-10-CM

## 2013-11-15 ENCOUNTER — Encounter: Payer: Self-pay | Admitting: *Deleted

## 2013-11-16 ENCOUNTER — Encounter (HOSPITAL_COMMUNITY): Payer: Medicare Other

## 2013-11-16 ENCOUNTER — Other Ambulatory Visit (HOSPITAL_COMMUNITY): Payer: Medicare Other

## 2013-11-23 ENCOUNTER — Encounter (HOSPITAL_COMMUNITY)
Admission: RE | Admit: 2013-11-23 | Discharge: 2013-11-23 | Disposition: A | Discharge status: Home / Self Care | Payer: Medicare Other | Source: Ambulatory Visit | Attending: Cardiovascular Disease | Admitting: Cardiovascular Disease

## 2013-11-23 ENCOUNTER — Encounter (HOSPITAL_COMMUNITY): Payer: Self-pay

## 2013-11-23 ENCOUNTER — Ambulatory Visit (HOSPITAL_COMMUNITY)
Admission: RE | Admit: 2013-11-23 | Discharge: 2013-11-23 | Disposition: A | Discharge status: Home / Self Care | Payer: Medicare Other | Source: Ambulatory Visit | Attending: Cardiovascular Disease | Admitting: Cardiovascular Disease

## 2013-11-23 DIAGNOSIS — I482 Chronic atrial fibrillation, unspecified: Secondary | ICD-10-CM

## 2013-11-23 DIAGNOSIS — R0602 Shortness of breath: Secondary | ICD-10-CM | POA: Insufficient documentation

## 2013-11-23 DIAGNOSIS — R5381 Other malaise: Secondary | ICD-10-CM | POA: Insufficient documentation

## 2013-11-23 DIAGNOSIS — R0989 Other specified symptoms and signs involving the circulatory and respiratory systems: Principal | ICD-10-CM | POA: Insufficient documentation

## 2013-11-23 DIAGNOSIS — R0609 Other forms of dyspnea: Secondary | ICD-10-CM | POA: Diagnosis present

## 2013-11-23 DIAGNOSIS — R9431 Abnormal electrocardiogram [ECG] [EKG]: Secondary | ICD-10-CM | POA: Diagnosis present

## 2013-11-23 DIAGNOSIS — I4891 Unspecified atrial fibrillation: Secondary | ICD-10-CM | POA: Diagnosis not present

## 2013-11-23 DIAGNOSIS — R5383 Other fatigue: Secondary | ICD-10-CM

## 2013-11-23 MED ORDER — SODIUM CHLORIDE 0.9 % IJ SOLN
10.0000 mL | INTRAMUSCULAR | Status: DC | PRN
  Administered 2013-11-23: 10 mL via INTRAVENOUS

## 2013-11-23 MED ORDER — REGADENOSON 0.4 MG/5ML IV SOLN
INTRAVENOUS | Status: AC
  Filled 2013-11-23: qty 5

## 2013-11-23 MED ORDER — SODIUM CHLORIDE 0.9 % IJ SOLN
INTRAMUSCULAR | Status: AC
  Filled 2013-11-23: qty 10

## 2013-11-23 MED ORDER — TECHNETIUM TC 99M SESTAMIBI GENERIC - CARDIOLITE
10.0000 | Freq: Once | INTRAVENOUS | Status: AC | PRN
  Administered 2013-11-23: 10 via INTRAVENOUS

## 2013-11-23 MED ORDER — TECHNETIUM TC 99M SESTAMIBI - CARDIOLITE
30.0000 | Freq: Once | INTRAVENOUS | Status: AC | PRN
  Administered 2013-11-23: 11:00:00 30 via INTRAVENOUS

## 2013-11-23 MED ORDER — REGADENOSON 0.4 MG/5ML IV SOLN
0.4000 mg | Freq: Once | INTRAVENOUS | Status: AC | PRN
  Administered 2013-11-23: 0.4 mg via INTRAVENOUS

## 2013-11-23 NOTE — Progress Notes (Signed)
Stress Lab Nurses Notes - [place] 11/23/2013 Reason for doing test: Dyspnea with Exertion, fatigue & AFib Type of test: Wille Glaser Nurse performing test: Gerrit Halls, RN Nuclear Medicine Tech: Redmond Baseman Echo Tech: Not Applicable MD performing test: Branch/K.Lawrence NP Family MD: Woody Seller Test explained and consent signed: yes IV started: 22g jelco, Saline lock flushed, No redness or edema and Saline lock started in radiology Symptoms: SOB & Nausea Treatment/Intervention: None Reason test stopped: protocol completed After recovery IV was: Discontinued via X-ray tech and No redness or edema Patient to return to [place]. Med at :11:45 Patient discharged: Home Patient's Condition upon discharge was: stable Comments: During test BP 138/87 & HR 130.  Recovery BP 129/58 & HR 74.  Symptoms resolved in recovery. Geanie Cooley T

## 2013-12-08 ENCOUNTER — Telehealth: Payer: Self-pay | Admitting: *Deleted

## 2013-12-08 NOTE — Telephone Encounter (Signed)
Notes Recorded by Laurine Blazer, LPN on 07/19/5282 at 1:32 AM Patient notified. Will send message to [facility]) for scheduling. ------------------------------------------------------------------------------------------------  She can follow up in August. Thanks for the update.  Jamesetta So   ----- Message ----- From: Laurine Blazer, LPN Sent: 4/40/1027 25:36 AM To: Herminio Commons, MD   When would you like to see her back for follow up? Your dictation stated 6 weeks, but was not scheduled upon check-out. She has Medicare & [employer]. Now her [place] Medicaid requires [place] to get approval from her PMD before they will okay her another visit. Let me know & I will forward to Vicky to take care of. Thanks, Edd Fabian   Notes Recorded by Satira Sark, MD on 11/26/2013 at 11:17 AM This is a patient of Dr. Bronson Ing. Results were forwarded to me in his absence, and in the absence of Ms. Lawrence NP. I have reviewed the records including recent office note. This study is overall low risk showing possible variable soft tissue attenuation versus potentially a very small area of anteroseptal ischemia. It would seem that medical therapy would be the appropriate first step unless symptoms escalate. Would keep office followup with Dr. Bronson Ing.

## 2013-12-27 ENCOUNTER — Telehealth: Payer: Self-pay | Admitting: *Deleted

## 2013-12-27 NOTE — Telephone Encounter (Signed)
Busy

## 2013-12-27 NOTE — Telephone Encounter (Signed)
Message copied by Laurine Blazer on Mon Dec 27, 2013  2:13 PM ------      Message from: [name]      Created: Wed Dec 22, 2013  6:00 PM      Regarding: pre op clearance       She will require 2 gm amoxicillin 30-60 minutes prior to dental extraction. She can safely proceed. Given her mechanical valve, she would require bridging with lovenox.            Elizabeth Wallace ------

## 2013-12-29 NOTE — Telephone Encounter (Signed)
Discussed with anticoagulation nurse, should be referred back to PMD as we do not manage her coumadin.

## 2013-12-30 NOTE — Telephone Encounter (Signed)
Attempted to notify patient - line busy.

## 2013-12-31 ENCOUNTER — Encounter (HOSPITAL_COMMUNITY): Payer: Medicare Other

## 2013-12-31 ENCOUNTER — Encounter: Payer: Medicare Other | Admitting: Vascular Surgery

## 2014-01-04 NOTE — Telephone Encounter (Signed)
Patient notified.  Stated that Dr. Woody Seller had already given her the antibiotic.  Advised patient that she may want to call her dentist back to inform them to fax request to PMD as they have been managing her coumadin.  Patient verbalized understanding.

## 2014-01-05 ENCOUNTER — Other Ambulatory Visit: Payer: Self-pay | Admitting: Cardiovascular Disease

## 2014-01-05 ENCOUNTER — Encounter: Payer: Self-pay | Admitting: Cardiovascular Disease

## 2014-01-05 ENCOUNTER — Ambulatory Visit (INDEPENDENT_AMBULATORY_CARE_PROVIDER_SITE_OTHER): Payer: Medicare Other | Admitting: Cardiovascular Disease

## 2014-01-05 VITALS — BP 105/67 | HR 72 | Ht 64.0 in | Wt 151.0 lb

## 2014-01-05 DIAGNOSIS — F329 Major depressive disorder, single episode, unspecified: Secondary | ICD-10-CM

## 2014-01-05 DIAGNOSIS — R0602 Shortness of breath: Secondary | ICD-10-CM

## 2014-01-05 DIAGNOSIS — I8390 Asymptomatic varicose veins of unspecified lower extremity: Secondary | ICD-10-CM

## 2014-01-05 DIAGNOSIS — I482 Chronic atrial fibrillation, unspecified: Secondary | ICD-10-CM

## 2014-01-05 DIAGNOSIS — Z954 Presence of other heart-valve replacement: Secondary | ICD-10-CM

## 2014-01-05 DIAGNOSIS — I350 Nonrheumatic aortic (valve) stenosis: Secondary | ICD-10-CM

## 2014-01-05 DIAGNOSIS — I872 Venous insufficiency (chronic) (peripheral): Secondary | ICD-10-CM

## 2014-01-05 DIAGNOSIS — I4891 Unspecified atrial fibrillation: Secondary | ICD-10-CM

## 2014-01-05 DIAGNOSIS — F3289 Other specified depressive episodes: Secondary | ICD-10-CM

## 2014-01-05 DIAGNOSIS — Z952 Presence of prosthetic heart valve: Secondary | ICD-10-CM

## 2014-01-05 DIAGNOSIS — I359 Nonrheumatic aortic valve disorder, unspecified: Secondary | ICD-10-CM

## 2014-01-05 DIAGNOSIS — I519 Heart disease, unspecified: Secondary | ICD-10-CM

## 2014-01-05 DIAGNOSIS — F32A Depression, unspecified: Secondary | ICD-10-CM

## 2014-01-05 NOTE — Patient Instructions (Signed)
   Continue all current medications.  Follow up in 6 months.

## 2014-01-05 NOTE — Progress Notes (Signed)
Patient ID: Martin Majestic, female   DOB: 1934/01/19, 80 y.o.   MRN: [id number]      SUBJECTIVE: Mrs. Chirico has a cardiac history which is notable for permanent atrial fibrillation and valvular heart disease, status post mechanical MVR and bioprosthetic TVR. She also has history of atypical CP with a negative catheterization in 2002. She has chronic peripheral edema with venous varicosities. She also has depression, and takes care of her disabled husband.  Her echo in 04/2013 demonstrated normal LV systolic function, moderate AS and AI, and normally functioning prosthetic mitral and tricuspid valves.  She underwent a nuclear stress test for fatigue and exertional dyspnea which was deemed low risk, demonstrating a small very mild partially reversible anteroseptal wall defect with normal wall motion, potentially due to variation in  breast positioning, but a small area of ischemia could not entirely be ruled out. LVEF 77%.  She denies chest pain. She said she occasionally gets short of breath but this is relieved with taking deep breaths and using her Lasix. She has bilateral knee swelling related to arthritis. She has venous varicosities and is scheduled to see vascular surgery next month.  She tells me that her husband, Mikaella Escalona, who is also my patient, was recently diagnosed with lung cancer.   Review of Systems: As per "subjective", otherwise negative.  Allergies  Allergen Reactions  . Oxycodone-Acetaminophen     REACTION: gi upset    Current Outpatient Prescriptions  Medication Sig Dispense Refill  . ALPRAZolam (XANAX) 0.5 MG tablet Take 0.5 mg by mouth 3 (three) times daily.       Marland Kitchen dexlansoprazole (DEXILANT) 60 MG capsule Take 60 mg by mouth daily.      . fexofenadine (ALLEGRA) 180 MG tablet Take 180 mg by mouth daily.      . furosemide (LASIX) 40 MG tablet Take 40 mg by mouth daily.      Marland Kitchen glipiZIDE (GLUCOTROL XL) 2.5 MG 24 hr tablet Take 2.5 mg by mouth daily.      Marland Kitchen  levocetirizine (XYZAL) 5 MG tablet Take 1 tablet by mouth daily.      . metoprolol succinate (TOPROL-XL) 50 MG 24 hr tablet Take 1 tablet (50 mg total) by mouth daily.  30 tablet  6  . olopatadine (PATANOL) 0.1 % ophthalmic solution Place 1 drop into both eyes 2 (two) times daily.      . polyethylene glycol (MIRALAX / GLYCOLAX) packet Take 17 g by mouth daily.       . potassium chloride (KLOR-CON) 10 MEQ CR tablet Take 10 mEq by mouth daily.       . raloxifene (EVISTA) 60 MG tablet Take 60 mg by mouth daily.        . simvastatin (ZOCOR) 40 MG tablet Take 40 mg by mouth at bedtime.        . sucralfate (CARAFATE) 1 GM/10ML suspension Take 1 g by mouth 3 (three) times daily as needed.        . traMADol-acetaminophen (ULTRACET) 37.5-325 MG per tablet Take 2 tablets by mouth 3 (three) times daily.       Marland Kitchen triamcinolone cream (KENALOG) 0.1 % Apply 1 application topically 2 (two) times daily.      . VOLTAREN 1 % GEL Apply 1 application topically as needed.      . warfarin (COUMADIN) 5 MG tablet Take 5 mg by mouth daily. 5mg  T, W, Th, Saturday  All other days 2.5mg        No  current facility-administered medications for this visit.    Past Medical History  Diagnosis Date  . History of mitral valve replacement with mechanical valve      St. Jude's bileaflet prosthesis  . S/P tricuspid valve replacement      bioprosthetic valve  . Chronic atrial fibrillation     currently normal sinus rhythm  . S/P patent foramen ovale closure   . Chronic anticoagulation   . Nephrolithiasis   . Irritable bowel syndrome     constipation  . Atypical chest pain     Negative catheterization 2002, low risk Cardiolite study November 2011  . Diabetes mellitus   . Moderate aortic stenosis by prior echocardiogram      asymptomatic  . Depression   . LV dysfunction     echocardiogram March 2013 by her primary care physician ejection fraction 40-45%, moderate aortic stenosis, prosthetic valves poorly reported , small  pericardial effusion.  . Intermittent claudication     NL ABIs, 12/2011    No past surgical history on file.  History   Social History  . Marital Status: Married    Spouse Name: N/A    Number of Children: N/A  . Years of Education: N/A   Occupational History  . Not on file.   Social History Main Topics  . Smoking status: Never Smoker   . Smokeless tobacco: Never Used  . Alcohol Use: Not on file  . Drug Use: Not on file  . Sexual Activity: Not on file   Other Topics Concern  . Not on file   Social History Narrative  . No narrative on file     Filed Vitals:   01/05/14 1334  BP: 105/67  Pulse: 72  Height: 5\' 4"  (1.626 m)  Weight: 151 lb (68.493 kg)  SpO2: 97%    PHYSICAL EXAM General: NAD, flat affect  Neck: No JVD, no thyromegaly or thyroid nodule.  Lungs: Clear to auscultation bilaterally with normal respiratory effort.  CV: Nondisplaced PMI. Mostly regular with some mild irregularlity, S1 click/S2, no S3, II/VI mid-peaking systolic murmur loudest at RUSB. Minimal peripheral edema with b/l venous varicosities.  Abdomen: Soft, nontender, no hepatosplenomegaly, no distention.  Neurologic: Alert and oriented x 3.  Psych: Flat affect, tearful.  Extremities: No clubbing or cyanosis. Stasis dermatitis.   ECG: reviewed and available in electronic records.      ASSESSMENT AND PLAN: 1. Permanent atrial fibrillation: Rate controlled, on warfarin. Continue Toprol-XL 50 mg daily.  2. S/p MVR and TVR: Stable as per 04/2013 echo.  3. LV dysfunction: Resolved, evidenced by echocardiogram in November 2014.  4. Lower extremity swelling: She has venous insufficiency, and I recommend continued use of support hose and leg elevation with pillows while sleeping. I previously made a referral to vascular surgery and she is scheduled to see Dr. Bridgett Larsson next month.  5. Aortic stenosis: Moderate by echo in 04/2013, with moderate AI.  6. Shortness of breath and fatigue: Energy levels  have diminished. Her fatigue may be due to depression, given her social stressors with regards to her son and disabled husband. Lexiscan Cardiolite stress test was low risk.  Dispo: f/u 6 months.  Kate Sable, M.D., F.A.C.C.

## 2014-01-20 ENCOUNTER — Encounter: Payer: Self-pay | Admitting: Vascular Surgery

## 2014-01-21 ENCOUNTER — Encounter: Payer: Medicare Other | Admitting: Vascular Surgery

## 2014-01-21 ENCOUNTER — Encounter (HOSPITAL_COMMUNITY): Payer: Medicare Other

## 2014-01-25 ENCOUNTER — Inpatient Hospital Stay (HOSPITAL_COMMUNITY)
Admission: EM | Admit: 2014-01-25 | Discharge: 2014-01-30 | DRG: 812 | Disposition: A | Discharge status: Home Health | Payer: Medicare Other | Attending: Internal Medicine | Admitting: Internal Medicine

## 2014-01-25 ENCOUNTER — Encounter (HOSPITAL_COMMUNITY): Payer: Self-pay | Admitting: Emergency Medicine

## 2014-01-25 DIAGNOSIS — D500 Iron deficiency anemia secondary to blood loss (chronic): Secondary | ICD-10-CM

## 2014-01-25 DIAGNOSIS — Z7901 Long term (current) use of anticoagulants: Secondary | ICD-10-CM

## 2014-01-25 DIAGNOSIS — R5383 Other fatigue: Secondary | ICD-10-CM

## 2014-01-25 DIAGNOSIS — K068 Other specified disorders of gingiva and edentulous alveolar ridge: Secondary | ICD-10-CM

## 2014-01-25 DIAGNOSIS — R5381 Other malaise: Secondary | ICD-10-CM

## 2014-01-25 DIAGNOSIS — F3289 Other specified depressive episodes: Secondary | ICD-10-CM

## 2014-01-25 DIAGNOSIS — Z85828 Personal history of other malignant neoplasm of skin: Secondary | ICD-10-CM

## 2014-01-25 DIAGNOSIS — I872 Venous insufficiency (chronic) (peripheral): Secondary | ICD-10-CM

## 2014-01-25 DIAGNOSIS — G43909 Migraine, unspecified, not intractable, without status migrainosus: Secondary | ICD-10-CM

## 2014-01-25 DIAGNOSIS — Z9889 Other specified postprocedural states: Secondary | ICD-10-CM

## 2014-01-25 DIAGNOSIS — R0609 Other forms of dyspnea: Secondary | ICD-10-CM

## 2014-01-25 DIAGNOSIS — K055 Other periodontal diseases: Secondary | ICD-10-CM | POA: Diagnosis present

## 2014-01-25 DIAGNOSIS — D62 Acute posthemorrhagic anemia: Secondary | ICD-10-CM | POA: Diagnosis not present

## 2014-01-25 DIAGNOSIS — R002 Palpitations: Secondary | ICD-10-CM

## 2014-01-25 DIAGNOSIS — I951 Orthostatic hypotension: Secondary | ICD-10-CM

## 2014-01-25 DIAGNOSIS — F411 Generalized anxiety disorder: Secondary | ICD-10-CM | POA: Diagnosis present

## 2014-01-25 DIAGNOSIS — K219 Gastro-esophageal reflux disease without esophagitis: Secondary | ICD-10-CM | POA: Diagnosis present

## 2014-01-25 DIAGNOSIS — R413 Other amnesia: Secondary | ICD-10-CM

## 2014-01-25 DIAGNOSIS — Z8774 Personal history of (corrected) congenital malformations of heart and circulatory system: Secondary | ICD-10-CM

## 2014-01-25 DIAGNOSIS — R079 Chest pain, unspecified: Secondary | ICD-10-CM

## 2014-01-25 DIAGNOSIS — I5032 Chronic diastolic (congestive) heart failure: Secondary | ICD-10-CM

## 2014-01-25 DIAGNOSIS — I509 Heart failure, unspecified: Secondary | ICD-10-CM | POA: Diagnosis present

## 2014-01-25 DIAGNOSIS — F329 Major depressive disorder, single episode, unspecified: Secondary | ICD-10-CM

## 2014-01-25 DIAGNOSIS — IMO0001 Reserved for inherently not codable concepts without codable children: Secondary | ICD-10-CM

## 2014-01-25 DIAGNOSIS — I482 Chronic atrial fibrillation, unspecified: Secondary | ICD-10-CM

## 2014-01-25 DIAGNOSIS — R45851 Suicidal ideations: Secondary | ICD-10-CM

## 2014-01-25 DIAGNOSIS — I70219 Atherosclerosis of native arteries of extremities with intermittent claudication, unspecified extremity: Secondary | ICD-10-CM | POA: Diagnosis present

## 2014-01-25 DIAGNOSIS — R58 Hemorrhage, not elsewhere classified: Secondary | ICD-10-CM

## 2014-01-25 DIAGNOSIS — M810 Age-related osteoporosis without current pathological fracture: Secondary | ICD-10-CM | POA: Diagnosis present

## 2014-01-25 DIAGNOSIS — N189 Chronic kidney disease, unspecified: Secondary | ICD-10-CM | POA: Diagnosis present

## 2014-01-25 DIAGNOSIS — F32A Depression, unspecified: Secondary | ICD-10-CM

## 2014-01-25 DIAGNOSIS — Z952 Presence of prosthetic heart valve: Secondary | ICD-10-CM

## 2014-01-25 DIAGNOSIS — Z86718 Personal history of other venous thrombosis and embolism: Secondary | ICD-10-CM

## 2014-01-25 DIAGNOSIS — R000 Tachycardia, unspecified: Secondary | ICD-10-CM

## 2014-01-25 DIAGNOSIS — K1379 Other lesions of oral mucosa: Secondary | ICD-10-CM

## 2014-01-25 DIAGNOSIS — R0602 Shortness of breath: Secondary | ICD-10-CM

## 2014-01-25 DIAGNOSIS — Z0389 Encounter for observation for other suspected diseases and conditions ruled out: Secondary | ICD-10-CM

## 2014-01-25 DIAGNOSIS — E119 Type 2 diabetes mellitus without complications: Secondary | ICD-10-CM | POA: Diagnosis present

## 2014-01-25 DIAGNOSIS — I129 Hypertensive chronic kidney disease with stage 1 through stage 4 chronic kidney disease, or unspecified chronic kidney disease: Secondary | ICD-10-CM | POA: Diagnosis present

## 2014-01-25 DIAGNOSIS — M353 Polymyalgia rheumatica: Secondary | ICD-10-CM

## 2014-01-25 DIAGNOSIS — I2510 Atherosclerotic heart disease of native coronary artery without angina pectoris: Secondary | ICD-10-CM | POA: Diagnosis present

## 2014-01-25 DIAGNOSIS — Z954 Presence of other heart-valve replacement: Secondary | ICD-10-CM

## 2014-01-25 DIAGNOSIS — Z66 Do not resuscitate: Secondary | ICD-10-CM | POA: Diagnosis present

## 2014-01-25 DIAGNOSIS — I350 Nonrheumatic aortic (valve) stenosis: Secondary | ICD-10-CM

## 2014-01-25 DIAGNOSIS — I4891 Unspecified atrial fibrillation: Secondary | ICD-10-CM | POA: Diagnosis present

## 2014-01-25 DIAGNOSIS — D649 Anemia, unspecified: Secondary | ICD-10-CM

## 2014-01-25 DIAGNOSIS — R0789 Other chest pain: Secondary | ICD-10-CM

## 2014-01-25 HISTORY — DX: Acute myocardial infarction, unspecified: I21.9

## 2014-01-25 HISTORY — DX: Unspecified osteoarthritis, unspecified site: M19.90

## 2014-01-25 HISTORY — DX: Age-related osteoporosis without current pathological fracture: M81.0

## 2014-01-25 HISTORY — DX: Gastro-esophageal reflux disease without esophagitis: K21.9

## 2014-01-25 HISTORY — PX: MULTIPLE TOOTH EXTRACTIONS: SHX2053

## 2014-01-25 HISTORY — DX: Type 2 diabetes mellitus without complications: E11.9

## 2014-01-25 HISTORY — DX: Anxiety disorder, unspecified: F41.9

## 2014-01-25 HISTORY — DX: Acute embolism and thrombosis of unspecified deep veins of unspecified lower extremity: I82.409

## 2014-01-25 HISTORY — DX: Unspecified malignant neoplasm of skin, unspecified: C44.90

## 2014-01-25 HISTORY — DX: Other chronic pain: G89.29

## 2014-01-25 HISTORY — DX: Low back pain, unspecified: M54.50

## 2014-01-25 HISTORY — DX: Low back pain: M54.5

## 2014-01-25 HISTORY — DX: Cardiac murmur, unspecified: R01.1

## 2014-01-25 HISTORY — DX: Anemia, unspecified: D64.9

## 2014-01-25 HISTORY — DX: Personal history of other medical treatment: Z92.89

## 2014-01-25 HISTORY — DX: Migraine, unspecified, not intractable, without status migrainosus: G43.909

## 2014-01-25 LAB — CBC WITH DIFFERENTIAL/PLATELET
BASOS PCT: 0 % (ref 0–1)
Basophils Absolute: 0 10*3/uL (ref 0.0–0.1)
Eosinophils Absolute: 0 10*3/uL (ref 0.0–0.7)
Eosinophils Relative: 0 % (ref 0–5)
HCT: 24.4 % — ABNORMAL LOW (ref 36.0–46.0)
HEMOGLOBIN: 8 g/dL — AB (ref 12.0–15.0)
Lymphocytes Relative: 10 % — ABNORMAL LOW (ref 12–46)
Lymphs Abs: 0.6 10*3/uL — ABNORMAL LOW (ref 0.7–4.0)
MCH: 30 pg (ref 26.0–34.0)
MCHC: 32.8 g/dL (ref 30.0–36.0)
MCV: 91.4 fL (ref 78.0–100.0)
MONOS PCT: 4 % (ref 3–12)
Monocytes Absolute: 0.3 10*3/uL (ref 0.1–1.0)
NEUTROS ABS: 5 10*3/uL (ref 1.7–7.7)
NEUTROS PCT: 85 % — AB (ref 43–77)
Platelets: 113 10*3/uL — ABNORMAL LOW (ref 150–400)
RBC: 2.67 MIL/uL — AB (ref 3.87–5.11)
RDW: 14.7 % (ref 11.5–15.5)
WBC: 5.9 10*3/uL (ref 4.0–10.5)

## 2014-01-25 LAB — BASIC METABOLIC PANEL
ANION GAP: 10 (ref 5–15)
BUN: 24 mg/dL — ABNORMAL HIGH (ref 6–23)
CHLORIDE: 109 meq/L (ref 96–112)
CO2: 23 mEq/L (ref 19–32)
Calcium: 8.3 mg/dL — ABNORMAL LOW (ref 8.4–10.5)
Creatinine, Ser: 1.17 mg/dL — ABNORMAL HIGH (ref 0.50–1.10)
GFR calc non Af Amer: 43 mL/min — ABNORMAL LOW (ref >90)
GFR, EST AFRICAN AMERICAN: 50 mL/min — AB (ref >90)
Glucose, Bld: 144 mg/dL — ABNORMAL HIGH (ref 70–99)
POTASSIUM: 4.4 meq/L (ref 3.7–5.3)
Sodium: 142 mEq/L (ref 137–147)

## 2014-01-25 LAB — PREPARE RBC (CROSSMATCH)

## 2014-01-25 MED ORDER — SODIUM CHLORIDE 0.9 % IV BOLUS (SEPSIS)
500.0000 mL | Freq: Once | INTRAVENOUS | Status: AC
  Administered 2014-01-25: 500 mL via INTRAVENOUS

## 2014-01-25 MED ORDER — SODIUM CHLORIDE 0.9 % IV SOLN
10.0000 mL/h | Freq: Once | INTRAVENOUS | Status: AC
  Administered 2014-01-26: 10 mL/h via INTRAVENOUS

## 2014-01-25 MED ORDER — AMINOCAPROIC ACID SOLUTION 5% (50 MG/ML)
5.0000 mL | Freq: Once | ORAL | Status: AC
  Administered 2014-01-25: 5 mL via ORAL
  Filled 2014-01-25 (×2): qty 100

## 2014-01-25 NOTE — ED Notes (Signed)
Per EMS, pt takes lasix and the ER states that they gave her vit k and 2L bolus.

## 2014-01-25 NOTE — ED Notes (Signed)
Pt states that she had 4 teeth removed this AM. Pt states that she has been having uncontrolled bleeding since around 1000. Pt went to [facility] ER, and was transferred here for an oral surgery consult. Stefanie Libel, MD paged and responded.

## 2014-01-25 NOTE — ED Provider Notes (Addendum)
CSN: [id number]     Arrival date & time 01/25/14  2208 History   First MD Initiated Contact with Patient 01/25/14 2216     Chief Complaint  Patient presents with  . Oral Pain  . Post-op Problem     (Consider location/radiation/quality/duration/timing/severity/associated sxs/prior Treatment) The history is provided by the patient.  Elizabeth Wallace is a 80 y.o. female hx of afib on coumadin, here with bleeding s/p extraction. Had tooth extraction earlier today. Seen then has been bleeding. Went to [facility] hospital ER and was given Vitamin K and IVF. Was noted to be bleeding continuously so ER called Dr. Stefanie Libel, oral surgery, who agreed to see patient here in the ED.    Past Medical History  Diagnosis Date  . History of mitral valve replacement with mechanical valve      St. Jude's bileaflet prosthesis  . S/P tricuspid valve replacement      bioprosthetic valve  . Chronic atrial fibrillation     currently normal sinus rhythm  . S/P patent foramen ovale closure   . Chronic anticoagulation   . Nephrolithiasis   . Irritable bowel syndrome     constipation  . Atypical chest pain     Negative catheterization 2002, low risk Cardiolite study November 2011  . Diabetes mellitus   . Moderate aortic stenosis by prior echocardiogram      asymptomatic  . Depression   . LV dysfunction     echocardiogram March 2013 by her primary care physician ejection fraction 40-45%, moderate aortic stenosis, prosthetic valves poorly reported , small pericardial effusion.  . Intermittent claudication     NL ABIs, 12/2011   Past Surgical History  Procedure Laterality Date  . Tooth extraction  01/25/2014   History reviewed. No pertinent family history. History  Substance Use Topics  . Smoking status: Never Smoker   . Smokeless tobacco: Never Used  . Alcohol Use: No   OB History   Grav Para Term Preterm Abortions TAB SAB Ect Mult Living                 Review of Systems  HENT:       Oral bleeding    All other systems reviewed and are negative.     Allergies  Oxycodone-acetaminophen  Home Medications   Prior to Admission medications   Medication Sig Start Date End Date Taking? Authorizing Provider  ALPRAZolam Duanne Moron) 0.5 MG tablet Take 0.5 mg by mouth 3 (three) times daily.     Historical Provider, MD  dexlansoprazole (DEXILANT) 60 MG capsule Take 60 mg by mouth daily.    Historical Provider, MD  fexofenadine (ALLEGRA) 180 MG tablet Take 180 mg by mouth daily.    Historical Provider, MD  furosemide (LASIX) 40 MG tablet Take 40 mg by mouth daily. 07/07/13   Herminio Commons, MD  glipiZIDE (GLUCOTROL XL) 2.5 MG 24 hr tablet Take 2.5 mg by mouth daily.    Historical Provider, MD  levocetirizine (XYZAL) 5 MG tablet Take 1 tablet by mouth daily. 04/02/13   Historical Provider, MD  metoprolol succinate (TOPROL-XL) 50 MG 24 hr tablet TAKE 1 TABLET BY MOUTH ONCE A DAY. 01/05/14   Herminio Commons, MD  olopatadine (PATANOL) 0.1 % ophthalmic solution Place 1 drop into both eyes 2 (two) times daily.    Historical Provider, MD  polyethylene glycol (MIRALAX / GLYCOLAX) packet Take 17 g by mouth daily.     Historical Provider, MD  potassium chloride (KLOR-CON) 10 MEQ  CR tablet Take 10 mEq by mouth daily.     Historical Provider, MD  raloxifene (EVISTA) 60 MG tablet Take 60 mg by mouth daily.      Historical Provider, MD  simvastatin (ZOCOR) 40 MG tablet Take 40 mg by mouth at bedtime.      Historical Provider, MD  sucralfate (CARAFATE) 1 GM/10ML suspension Take 1 g by mouth 3 (three) times daily as needed.      Historical Provider, MD  traMADol-acetaminophen (ULTRACET) 37.5-325 MG per tablet Take 2 tablets by mouth 3 (three) times daily.     Historical Provider, MD  triamcinolone cream (KENALOG) 0.1 % Apply 1 application topically 2 (two) times daily.    Historical Provider, MD  VOLTAREN 1 % GEL Apply 1 application topically as needed. 04/09/13   Historical Provider, MD  warfarin (COUMADIN)  5 MG tablet Take 5 mg by mouth daily. 5mg  T, W, Th, Saturday  All other days 2.5mg     Historical Provider, MD   BP 98/68  Pulse 123  Temp(Src) 98 F (36.7 C) (Oral)  Resp 15  SpO2 100% Physical Exam  Nursing note and vitals reviewed. Constitutional: She is oriented to person, place, and time.  Chronically ill   HENT:  Head: Normocephalic.  Mouth/Throat:    Clot L upper premolar. Minimal oozing.   Eyes: Conjunctivae and EOM are normal. Pupils are equal, round, and reactive to light.  Neck: Normal range of motion. Neck supple.  Cardiovascular: Normal rate, regular rhythm and normal heart sounds.   Pulmonary/Chest: Effort normal and breath sounds normal.  Abdominal: Soft. Bowel sounds are normal. She exhibits no distension. There is no tenderness. There is no rebound and no guarding.  Musculoskeletal: Normal range of motion.  Neurological: She is alert and oriented to person, place, and time. No cranial nerve deficit. Coordination normal.  Skin: Skin is warm and dry.  Psychiatric: She has a normal mood and affect. Her behavior is normal. Judgment and thought content normal.    ED Course  Procedures (including critical care time)  CRITICAL CARE Performed by: Darl Householder, DAVID   Total critical care time: 30 min  Critical care time was exclusive of separately billable procedures and treating other patients.  Critical care was necessary to treat or prevent imminent or life-threatening deterioration.  Critical care was time spent personally by me on the following activities: development of treatment plan with patient and/or surrogate as well as nursing, discussions with consultants, evaluation of patient's response to treatment, examination of patient, obtaining history from patient or surrogate, ordering and performing treatments and interventions, ordering and review of laboratory studies, ordering and review of radiographic studies, pulse oximetry and re-evaluation of patient's  condition.  Labs Review Labs Reviewed  CBC WITH DIFFERENTIAL - Abnormal; Notable for the following:    RBC 2.67 (*)    Hemoglobin 8.0 (*)    HCT 24.4 (*)    Platelets 113 (*)    Neutrophils Relative % 85 (*)    Lymphocytes Relative 10 (*)    Lymphs Abs 0.6 (*)    All other components within normal limits  BASIC METABOLIC PANEL - Abnormal; Notable for the following:    Glucose, Bld 144 (*)    BUN 24 (*)    Creatinine, Ser 1.17 (*)    Calcium 8.3 (*)    GFR calc non Af Amer 43 (*)    GFR calc Af Amer 50 (*)    All other components within normal limits  TYPE AND  SCREEN  PREPARE RBC (CROSSMATCH)    Imaging Review No results found.   EKG Interpretation None      MDM   Final diagnoses:  None   Elizabeth Wallace is a 80 y.o. female here with bleeding from tooth extraction site. Hg at [facility] was 9.9 at 4pm. Will recheck. I called Dr. Stefanie Libel, who recommend amicar oral rinse and if it doesn't stop with it, page him back.   11 PM Patient became more hypotensive, tachycardic. Tried amicar, still bleeding. Called Dr. Stefanie Libel. Ordered blood since she is bleeding and is hypotensive.   12 AM Dr. Hoyt Koch at bedside. Signed out to Dr. Marcina Millard to reassess patient.     Wandra Arthurs, MD 01/26/14 Dyann Kief  Wandra Arthurs, MD 01/26/14 [phone]

## 2014-01-26 ENCOUNTER — Encounter (HOSPITAL_COMMUNITY): Payer: Self-pay | Admitting: Physician Assistant

## 2014-01-26 DIAGNOSIS — D649 Anemia, unspecified: Secondary | ICD-10-CM

## 2014-01-26 DIAGNOSIS — R58 Hemorrhage, not elsewhere classified: Secondary | ICD-10-CM | POA: Insufficient documentation

## 2014-01-26 DIAGNOSIS — R000 Tachycardia, unspecified: Secondary | ICD-10-CM

## 2014-01-26 DIAGNOSIS — M810 Age-related osteoporosis without current pathological fracture: Secondary | ICD-10-CM | POA: Diagnosis present

## 2014-01-26 DIAGNOSIS — I4891 Unspecified atrial fibrillation: Secondary | ICD-10-CM

## 2014-01-26 DIAGNOSIS — I509 Heart failure, unspecified: Secondary | ICD-10-CM | POA: Diagnosis present

## 2014-01-26 DIAGNOSIS — D62 Acute posthemorrhagic anemia: Secondary | ICD-10-CM | POA: Diagnosis present

## 2014-01-26 DIAGNOSIS — Z85828 Personal history of other malignant neoplasm of skin: Secondary | ICD-10-CM | POA: Diagnosis not present

## 2014-01-26 DIAGNOSIS — Z86718 Personal history of other venous thrombosis and embolism: Secondary | ICD-10-CM | POA: Diagnosis not present

## 2014-01-26 DIAGNOSIS — R45851 Suicidal ideations: Secondary | ICD-10-CM | POA: Diagnosis not present

## 2014-01-26 DIAGNOSIS — I2510 Atherosclerotic heart disease of native coronary artery without angina pectoris: Secondary | ICD-10-CM | POA: Diagnosis present

## 2014-01-26 DIAGNOSIS — I70219 Atherosclerosis of native arteries of extremities with intermittent claudication, unspecified extremity: Secondary | ICD-10-CM | POA: Diagnosis present

## 2014-01-26 DIAGNOSIS — E119 Type 2 diabetes mellitus without complications: Secondary | ICD-10-CM | POA: Diagnosis present

## 2014-01-26 DIAGNOSIS — R0989 Other specified symptoms and signs involving the circulatory and respiratory systems: Secondary | ICD-10-CM

## 2014-01-26 DIAGNOSIS — Z7901 Long term (current) use of anticoagulants: Secondary | ICD-10-CM | POA: Diagnosis not present

## 2014-01-26 DIAGNOSIS — K068 Other specified disorders of gingiva and edentulous alveolar ridge: Secondary | ICD-10-CM | POA: Diagnosis present

## 2014-01-26 DIAGNOSIS — Z9889 Other specified postprocedural states: Secondary | ICD-10-CM | POA: Diagnosis not present

## 2014-01-26 DIAGNOSIS — Z954 Presence of other heart-valve replacement: Secondary | ICD-10-CM | POA: Diagnosis not present

## 2014-01-26 DIAGNOSIS — I359 Nonrheumatic aortic valve disorder, unspecified: Secondary | ICD-10-CM

## 2014-01-26 DIAGNOSIS — N189 Chronic kidney disease, unspecified: Secondary | ICD-10-CM | POA: Diagnosis present

## 2014-01-26 DIAGNOSIS — F411 Generalized anxiety disorder: Secondary | ICD-10-CM | POA: Diagnosis present

## 2014-01-26 DIAGNOSIS — I5032 Chronic diastolic (congestive) heart failure: Secondary | ICD-10-CM | POA: Diagnosis present

## 2014-01-26 DIAGNOSIS — K055 Other periodontal diseases: Secondary | ICD-10-CM | POA: Diagnosis present

## 2014-01-26 DIAGNOSIS — F3289 Other specified depressive episodes: Secondary | ICD-10-CM | POA: Diagnosis present

## 2014-01-26 DIAGNOSIS — Z66 Do not resuscitate: Secondary | ICD-10-CM | POA: Diagnosis present

## 2014-01-26 DIAGNOSIS — R0609 Other forms of dyspnea: Secondary | ICD-10-CM

## 2014-01-26 DIAGNOSIS — F329 Major depressive disorder, single episode, unspecified: Secondary | ICD-10-CM | POA: Diagnosis present

## 2014-01-26 DIAGNOSIS — K1370 Unspecified lesions of oral mucosa: Secondary | ICD-10-CM

## 2014-01-26 DIAGNOSIS — K219 Gastro-esophageal reflux disease without esophagitis: Secondary | ICD-10-CM | POA: Diagnosis present

## 2014-01-26 DIAGNOSIS — I129 Hypertensive chronic kidney disease with stage 1 through stage 4 chronic kidney disease, or unspecified chronic kidney disease: Secondary | ICD-10-CM | POA: Diagnosis present

## 2014-01-26 LAB — HEPARIN LEVEL (UNFRACTIONATED): Heparin Unfractionated: 0.47 IU/mL (ref 0.30–0.70)

## 2014-01-26 LAB — CBC
HCT: 24.8 % — ABNORMAL LOW (ref 36.0–46.0)
HCT: 22 % — ABNORMAL LOW (ref 36.0–46.0)
HEMOGLOBIN: 8.1 g/dL — AB (ref 12.0–15.0)
Hemoglobin: 7 g/dL — ABNORMAL LOW (ref 12.0–15.0)
MCH: 29.8 pg (ref 26.0–34.0)
MCH: 29.2 pg (ref 26.0–34.0)
MCHC: 32.7 g/dL (ref 30.0–36.0)
MCHC: 31.8 g/dL (ref 30.0–36.0)
MCV: 91.2 fL (ref 78.0–100.0)
MCV: 91.7 fL (ref 78.0–100.0)
PLATELETS: 123 10*3/uL — AB (ref 150–400)
Platelets: 133 10*3/uL — ABNORMAL LOW (ref 150–400)
RBC: 2.72 MIL/uL — ABNORMAL LOW (ref 3.87–5.11)
RBC: 2.4 MIL/uL — AB (ref 3.87–5.11)
RDW: 14.8 % (ref 11.5–15.5)
RDW: 14.9 % (ref 11.5–15.5)
WBC: 6.5 10*3/uL (ref 4.0–10.5)
WBC: 4.9 10*3/uL (ref 4.0–10.5)

## 2014-01-26 LAB — HEMOGLOBIN AND HEMATOCRIT, BLOOD
HCT: 21.4 % — ABNORMAL LOW (ref 36.0–46.0)
HCT: 25 % — ABNORMAL LOW (ref 36.0–46.0)
HEMOGLOBIN: 7.1 g/dL — AB (ref 12.0–15.0)
Hemoglobin: 8.4 g/dL — ABNORMAL LOW (ref 12.0–15.0)

## 2014-01-26 LAB — HEMOGLOBIN A1C
Hgb A1c MFr Bld: 6.5 % — ABNORMAL HIGH (ref <5.7)
Mean Plasma Glucose: 140 mg/dL — ABNORMAL HIGH (ref <117)

## 2014-01-26 LAB — D-DIMER, QUANTITATIVE: D-Dimer, Quant: <0.27 ug/mL-FEU (ref 0.00–0.48)

## 2014-01-26 LAB — GLUCOSE, CAPILLARY
GLUCOSE-CAPILLARY: 160 mg/dL — AB (ref 70–99)
GLUCOSE-CAPILLARY: 161 mg/dL — AB (ref 70–99)
GLUCOSE-CAPILLARY: 144 mg/dL — AB (ref 70–99)
Glucose-Capillary: 190 mg/dL — ABNORMAL HIGH (ref 70–99)

## 2014-01-26 LAB — CBG MONITORING, ED: Glucose-Capillary: 138 mg/dL — ABNORMAL HIGH (ref 70–99)

## 2014-01-26 LAB — PROTIME-INR
INR: 1.28 (ref 0.00–1.49)
Prothrombin Time: 16 seconds — ABNORMAL HIGH (ref 11.6–15.2)

## 2014-01-26 LAB — TROPONIN I
Troponin I: <0.3 ng/mL (ref <0.30)
Troponin I: <0.3 ng/mL (ref <0.30)

## 2014-01-26 LAB — ABO/RH: ABO/RH(D): B POS

## 2014-01-26 LAB — PREPARE RBC (CROSSMATCH)

## 2014-01-26 LAB — TSH: TSH: 1.33 u[IU]/mL (ref 0.350–4.500)

## 2014-01-26 MED ORDER — SODIUM CHLORIDE 0.9 % IV SOLN: INTRAVENOUS | Status: DC

## 2014-01-26 MED ORDER — POTASSIUM CHLORIDE CRYS ER 10 MEQ PO TBCR
10.0000 meq | EXTENDED_RELEASE_TABLET | Freq: Every day | ORAL | Status: DC
  Administered 2014-01-26 – 2014-01-30 (×5): 10 meq via ORAL
  Filled 2014-01-26 (×5): qty 1

## 2014-01-26 MED ORDER — SIMVASTATIN 40 MG PO TABS
40.0000 mg | ORAL_TABLET | Freq: Every day | ORAL | Status: DC
  Administered 2014-01-26 – 2014-01-29 (×4): 40 mg via ORAL
  Filled 2014-01-26 (×5): qty 1

## 2014-01-26 MED ORDER — LORATADINE 10 MG PO TABS
10.0000 mg | ORAL_TABLET | Freq: Every day | ORAL | Status: DC
  Administered 2014-01-26 – 2014-01-30 (×5): 10 mg via ORAL
  Filled 2014-01-26 (×5): qty 1

## 2014-01-26 MED ORDER — MORPHINE SULFATE 2 MG/ML IJ SOLN
2.0000 mg | Freq: Once | INTRAMUSCULAR | Status: AC
  Administered 2014-01-26: 2 mg via INTRAVENOUS
  Filled 2014-01-26: qty 1

## 2014-01-26 MED ORDER — FUROSEMIDE 40 MG PO TABS
40.0000 mg | ORAL_TABLET | Freq: Every day | ORAL | Status: DC
  Administered 2014-01-26: 40 mg via ORAL
  Filled 2014-01-26 (×2): qty 1

## 2014-01-26 MED ORDER — TRAMADOL-ACETAMINOPHEN 37.5-325 MG PO TABS
2.0000 | ORAL_TABLET | Freq: Three times a day (TID) | ORAL | Status: DC
  Administered 2014-01-26 – 2014-01-30 (×13): 2 via ORAL
  Filled 2014-01-26 (×13): qty 2

## 2014-01-26 MED ORDER — GLIPIZIDE ER 2.5 MG PO TB24
2.5000 mg | ORAL_TABLET | Freq: Every day | ORAL | Status: DC
  Filled 2014-01-26: qty 1

## 2014-01-26 MED ORDER — INSULIN ASPART 100 UNIT/ML ~~LOC~~ SOLN
0.0000 [IU] | Freq: Every day | SUBCUTANEOUS | Status: DC
  Administered 2014-01-29: 2 [IU] via SUBCUTANEOUS

## 2014-01-26 MED ORDER — SODIUM CHLORIDE 0.9 % IV SOLN
Freq: Once | INTRAVENOUS | Status: AC
  Administered 2014-01-26: 11:00:00 via INTRAVENOUS

## 2014-01-26 MED ORDER — HEPARIN (PORCINE) IN NACL 100-0.45 UNIT/ML-% IJ SOLN
900.0000 [IU]/h | INTRAMUSCULAR | Status: DC
  Administered 2014-01-26: 900 [IU]/h via INTRAVENOUS
  Filled 2014-01-26 (×2): qty 250

## 2014-01-26 MED ORDER — OLOPATADINE HCL 0.1 % OP SOLN
1.0000 [drp] | Freq: Two times a day (BID) | OPHTHALMIC | Status: DC
  Administered 2014-01-26 – 2014-01-30 (×9): 1 [drp] via OPHTHALMIC
  Filled 2014-01-26: qty 5

## 2014-01-26 MED ORDER — HYDROMORPHONE HCL PF 1 MG/ML IJ SOLN
0.5000 mg | Freq: Once | INTRAMUSCULAR | Status: AC
  Administered 2014-01-26: 0.5 mg via INTRAVENOUS
  Filled 2014-01-26: qty 1

## 2014-01-26 MED ORDER — INSULIN ASPART 100 UNIT/ML ~~LOC~~ SOLN
0.0000 [IU] | Freq: Three times a day (TID) | SUBCUTANEOUS | Status: DC
  Administered 2014-01-26 – 2014-01-27 (×4): 2 [IU] via SUBCUTANEOUS
  Administered 2014-01-27: 1 [IU] via SUBCUTANEOUS
  Administered 2014-01-27: 2 [IU] via SUBCUTANEOUS
  Administered 2014-01-28 – 2014-01-29 (×4): 1 [IU] via SUBCUTANEOUS
  Administered 2014-01-29: 2 [IU] via SUBCUTANEOUS

## 2014-01-26 MED ORDER — ENOXAPARIN SODIUM 60 MG/0.6ML ~~LOC~~ SOLN: 60.0000 mg | SUBCUTANEOUS | Status: DC

## 2014-01-26 MED ORDER — METOPROLOL SUCCINATE ER 50 MG PO TB24
50.0000 mg | ORAL_TABLET | Freq: Every day | ORAL | Status: DC
  Administered 2014-01-26: 50 mg via ORAL
  Filled 2014-01-26 (×2): qty 1

## 2014-01-26 MED ORDER — RALOXIFENE HCL 60 MG PO TABS
60.0000 mg | ORAL_TABLET | Freq: Every day | ORAL | Status: DC
  Filled 2014-01-26: qty 1

## 2014-01-26 MED ORDER — ALPRAZOLAM 0.5 MG PO TABS
0.5000 mg | ORAL_TABLET | Freq: Three times a day (TID) | ORAL | Status: DC
  Administered 2014-01-26 – 2014-01-30 (×13): 0.5 mg via ORAL
  Filled 2014-01-26 (×13): qty 1

## 2014-01-26 MED ORDER — POLYETHYLENE GLYCOL 3350 17 G PO PACK
17.0000 g | PACK | Freq: Every day | ORAL | Status: DC
  Administered 2014-01-26 – 2014-01-30 (×5): 17 g via ORAL
  Filled 2014-01-26 (×5): qty 1

## 2014-01-26 MED ORDER — PANTOPRAZOLE SODIUM 40 MG PO TBEC
40.0000 mg | DELAYED_RELEASE_TABLET | Freq: Every day | ORAL | Status: DC
  Administered 2014-01-26 – 2014-01-30 (×5): 40 mg via ORAL
  Filled 2014-01-26 (×5): qty 1

## 2014-01-26 MED ORDER — SUCRALFATE 1 GM/10ML PO SUSP
1.0000 g | Freq: Three times a day (TID) | ORAL | Status: DC
  Administered 2014-01-26 – 2014-01-30 (×13): 1 g via ORAL
  Filled 2014-01-26 (×16): qty 10

## 2014-01-26 MED ORDER — AMOXICILLIN 500 MG PO CAPS
2000.0000 mg | ORAL_CAPSULE | Freq: Once | ORAL | Status: AC
  Administered 2014-01-26: 2000 mg via ORAL
  Filled 2014-01-26: qty 4

## 2014-01-26 NOTE — Progress Notes (Signed)
  Echocardiogram 2D Echocardiogram has been performed.  Elizabeth Wallace 01/26/2014, 5:04 PM

## 2014-01-26 NOTE — Progress Notes (Signed)
ANTICOAGULATION CONSULT NOTE - Initial Consult  Pharmacy Consult for Heparin Indication: St. Jude MVR/Afib  Allergies  Allergen Reactions  . Oxycodone-Acetaminophen     REACTION: gi upset    Patient Measurements: Height: 5\' 2"  (157.5 cm) Weight: 156 lb 14.4 oz (71.169 kg) IBW/kg (Calculated) : 50.1 Heparin Dosing Weight: 65 kg  Vital Signs: Temp: 98.2 F (36.8 C) (08/19 0823) Temp src: Oral (08/19 0823) BP: 105/63 mmHg (08/19 0823) Pulse Rate: 123 (08/19 0823)  Labs:  Recent Labs  01/25/14 2229 01/26/14 0215 01/26/14 0730  HGB 8.0* 8.1* 7.0*  HCT 24.4* 24.8* 22.0*  PLT 113* 133* 123*  CREATININE 1.17*  --   --   TROPONINI  --   --  <0.30    Estimated Creatinine Clearance: 36.6 ml/min (by C-G formula based on Cr of 1.17).   Medical History: Past Medical History  Diagnosis Date  . History of mitral valve replacement with mechanical valve      St. Jude's bileaflet prosthesis  . S/P tricuspid valve replacement      bioprosthetic valve  . Chronic atrial fibrillation     currently normal sinus rhythm  . S/P patent foramen ovale closure   . Chronic anticoagulation   . Nephrolithiasis   . Irritable bowel syndrome     constipation  . Atypical chest pain     Negative catheterization 2002, low risk Cardiolite study November 2011  . Diabetes mellitus   . Moderate aortic stenosis by prior echocardiogram      asymptomatic  . Depression   . LV dysfunction     echocardiogram March 2013 by her primary care physician ejection fraction 40-45%, moderate aortic stenosis, prosthetic valves poorly reported , small pericardial effusion.  . Intermittent claudication     NL ABIs, 12/2011    Assessment: 60 YOF with hx St. Jude MVR and Afib normally anticoagulated with warfarin PTA. The patient began holding warfarin starting on 8/15 for a dental procedure (tooth extraction) planned on 8/18. During this time the patient was bridged with Lovenox 60 mg bid - the patient's last  dose PTA was on 8/18 at 0700.   The patient underwent the dental procedure on 8/18 AM with 4 teeth removed and had achieved hemostasis upon leaving the dental office. Later that afternoon, the patient presented to [facility] with oral bleeding. The patient was given Vit K and transferred to [facility]. Oral surgery was consulted and hemostasis was once again achieved - the patient reports no bleeding this morning.   Pharmacy was consulted to start heparin for bridging this morning. It has been >24 hours since the patient's last lovenox dose. Will not bolus due to recent bleeding and will aim for the lower end of goal range. INR ordered - per discussion with MD, if patient tolerates heparin drip with no bleeding, to transition back to Enox/Warf on 8/20.   Goal of Therapy:  Heparin level 0.3-0.5 units/ml (lower goal due to recent oral bleeding) Monitor platelets by anticoagulation protocol: Yes   Plan:  1. Start heparin at a rate of 900 units/hr (9 ml/hr) 2. Daily heparin level 3. Will continue to monitor for any signs/symptoms of bleeding and will follow up with heparin level in 8 hours   Alycia Rossetti, PharmD, BCPS Clinical Pharmacist Pager: [phone] 01/26/2014 10:35 AM

## 2014-01-26 NOTE — H&P (Signed)
Elizabeth Wallace is an 80 y.o. female.   Chief Complaint: bleeding from gums HPI: 80 yo female with hx of dental procedure apparently had bleeding from gums,  Held coumadin 5 days prior.  Given vitamin K at [facility].  Pt sent to [facility] for evaluation by dental service and found to have taken lovenox.  Pt somewhat anemic, and will be admitted for observation of gum bleeding.  Appreciate dental service input.   Past Medical History  Diagnosis Date  . History of mitral valve replacement with mechanical valve      St. Jude's bileaflet prosthesis  . S/P tricuspid valve replacement      bioprosthetic valve  . Chronic atrial fibrillation     currently normal sinus rhythm  . S/P patent foramen ovale closure   . Chronic anticoagulation   . Nephrolithiasis   . Irritable bowel syndrome     constipation  . Atypical chest pain     Negative catheterization 2002, low risk Cardiolite study November 2011  . Diabetes mellitus   . Moderate aortic stenosis by prior echocardiogram      asymptomatic  . Depression   . LV dysfunction     echocardiogram March 2013 by her primary care physician ejection fraction 40-45%, moderate aortic stenosis, prosthetic valves poorly reported , small pericardial effusion.  . Intermittent claudication     NL ABIs, 12/2011    Past Surgical History  Procedure Laterality Date  . Tooth extraction  01/25/2014    History reviewed. No pertinent family history. Social History:  reports that she has never smoked. She has never used smokeless tobacco. She reports that she does not drink alcohol. Her drug history is not on file.  Allergies:  Allergies  Allergen Reactions  . Oxycodone-Acetaminophen     REACTION: gi upset     (Not in a hospital admission)  Results for orders placed during the hospital encounter of 01/25/14 (from the past 48 hour(s))  CBC WITH DIFFERENTIAL     Status: Abnormal   Collection Time    01/25/14 10:29 PM      Result Value Ref Range    WBC 5.9  4.0 - 10.5 K/uL   RBC 2.67 (*) 3.87 - 5.11 MIL/uL   Hemoglobin 8.0 (*) 12.0 - 15.0 g/dL   HCT 24.4 (*) 36.0 - 46.0 %   MCV 91.4  78.0 - 100.0 fL   MCH 30.0  26.0 - 34.0 pg   MCHC 32.8  30.0 - 36.0 g/dL   RDW 14.7  11.5 - 15.5 %   Platelets 113 (*) 150 - 400 K/uL   Comment: REPEATED TO VERIFY     SPECIMEN CHECKED FOR CLOTS     PLATELET COUNT CONFIRMED BY SMEAR   Neutrophils Relative % 85 (*) 43 - 77 %   Neutro Abs 5.0  1.7 - 7.7 K/uL   Lymphocytes Relative 10 (*) 12 - 46 %   Lymphs Abs 0.6 (*) 0.7 - 4.0 K/uL   Monocytes Relative 4  3 - 12 %   Monocytes Absolute 0.3  0.1 - 1.0 K/uL   Eosinophils Relative 0  0 - 5 %   Eosinophils Absolute 0.0  0.0 - 0.7 K/uL   Basophils Relative 0  0 - 1 %   Basophils Absolute 0.0  0.0 - 0.1 K/uL  BASIC METABOLIC PANEL     Status: Abnormal   Collection Time    01/25/14 10:29 PM      Result Value Ref Range  Sodium 142  137 - 147 mEq/L   Potassium 4.4  3.7 - 5.3 mEq/L   Chloride 109  96 - 112 mEq/L   CO2 23  19 - 32 mEq/L   Glucose, Bld 144 (*) 70 - 99 mg/dL   BUN 24 (*) 6 - 23 mg/dL   Creatinine, Ser 1.17 (*) 0.50 - 1.10 mg/dL   Calcium 8.3 (*) 8.4 - 10.5 mg/dL   GFR calc non Af Amer 43 (*) >90 mL/min   GFR calc Af Amer 50 (*) >90 mL/min   Comment: (NOTE)     The eGFR has been calculated using the CKD EPI equation.     This calculation has not been validated in all clinical situations.     eGFR's persistently <90 mL/min signify possible Chronic Kidney     Disease.   Anion gap 10  5 - 15  TYPE AND SCREEN     Status: None   Collection Time    01/25/14 11:10 PM      Result Value Ref Range   ABO/RH(D) B POS     Antibody Screen NEG     Sample Expiration 01/28/2014     Unit Number [id number]     Blood Component Type RED CELLS,LR     Unit division 00     Status of Unit ALLOCATED     Transfusion Status OK TO TRANSFUSE     Crossmatch Result Compatible    PREPARE RBC (CROSSMATCH)     Status: None   Collection Time     01/25/14 11:10 PM      Result Value Ref Range   Order Confirmation ORDER PROCESSED BY BLOOD BANK    ABO/RH     Status: None   Collection Time    01/25/14 11:10 PM      Result Value Ref Range   ABO/RH(D) B POS    CBG MONITORING, ED     Status: Abnormal   Collection Time    01/26/14  1:43 AM      Result Value Ref Range   Glucose-Capillary 138 (*) 70 - 99 mg/dL  CBC     Status: Abnormal   Collection Time    01/26/14  2:15 AM      Result Value Ref Range   WBC 6.5  4.0 - 10.5 K/uL   RBC 2.72 (*) 3.87 - 5.11 MIL/uL   Hemoglobin 8.1 (*) 12.0 - 15.0 g/dL   HCT 24.8 (*) 36.0 - 46.0 %   MCV 91.2  78.0 - 100.0 fL   MCH 29.8  26.0 - 34.0 pg   MCHC 32.7  30.0 - 36.0 g/dL   RDW 14.8  11.5 - 15.5 %   Platelets 133 (*) 150 - 400 K/uL   No results found.  ROS negative for all organ systems except for + above  Blood pressure 103/72, pulse 125, temperature 98.3 F (36.8 C), temperature source Oral, resp. rate 19, SpO2 97.00%. Physical Exam  Heent: anicteric,  bleeding from gums Neck: no jvd Heart: rrr s1, s2 Lung: ctab Abd: soft, nt, nd, +bs Ext: no c/c/e Skin: no rash Lymph: no adenopathy Neuro: nonfocal  Assessment/Plan Gum bleeding: observe, check pt, ptt Check serial cbc, stop lovenox for now.  Anemia:  If hgb<7.5 consider transfusion,  Type and screen  Anxiety: cont xanax.  Dm2: fsbs, ac and qhs, iss Hypertension: cont metoprolol, cont lasix Tachycardia: cycle cardiac markers, tsh, echo. D didimer, if d dimer + then please obtain CTA chest  [name] 01/26/2014, 6:26 AM

## 2014-01-26 NOTE — ED Provider Notes (Signed)
  Physical Exam  BP 103/72  Pulse 125  Temp(Src) 98.3 F (36.8 C) (Oral)  Resp 19  SpO2 97%  Physical Exam  ED Course  Procedures  MDM  Assuming care of patient from Dr. Darl Householder Patient in the ED for oral bleeding. On coumadin, and had teeth extraction today.  Hc drop from 9.9 --> 8  Pt is tachycardic. No dizziness, no chest pain, dib.  Hard to get decent pain control. She is asymptomatic with the Hb of 8, so we will not transfuse, she is tachycardic, repeat Hb is still 8, and the bleeding has ceased thanks to OMFS.  Admit to medicine.  Varney Biles, MD 01/26/14 [phone]

## 2014-01-26 NOTE — Consult Note (Signed)
CARDIOLOGY CONSULT NOTE   Patient ID: Elizabeth Wallace MRN: [id number], DOB/AGE: 78-10-1933   Admit date: 01/25/2014 Date of Consult: 01/26/2014   Primary Physician: Glenda Chroman., MD Primary Cardiologist: Dr. Burke Keels  Pt. Profile  80 year old Caucasian female with past medical history significant for history of atypical chest pain with negative cardiac catheterization in 2002, history of mitral valve repair with mechanical valve, history of TVR with bioprosthetic valve, history of permanent atrial fibrillation on Coumadin, hypertension, diabetes, and history of venous insufficiency in the lower extremity presented with gum bleeding after dental procedure and found to be in a-flutter with RVR  Problem List  Past Medical History  Diagnosis Date  . History of mitral valve replacement with mechanical valve      St. Jude's bileaflet prosthesis  . S/P tricuspid valve replacement      bioprosthetic valve  . Chronic atrial fibrillation     currently normal sinus rhythm  . S/P patent foramen ovale closure   . Chronic anticoagulation   . Nephrolithiasis   . Irritable bowel syndrome     constipation  . Atypical chest pain     Negative catheterization 2002, low risk Cardiolite study November 2011  . Diabetes mellitus   . Moderate aortic stenosis by prior echocardiogram      asymptomatic  . Depression   . LV dysfunction     echocardiogram March 2013 by her primary care physician ejection fraction 40-45%, moderate aortic stenosis, prosthetic valves poorly reported , small pericardial effusion.  . Intermittent claudication     NL ABIs, 12/2011    Past Surgical History  Procedure Laterality Date  . Tooth extraction  01/25/2014     Allergies  Allergies  Allergen Reactions  . Oxycodone-Acetaminophen     REACTION: gi upset    HPI   The patient is a 80 year old Caucasian female with past medical history significant for history of atypical chest pain with negative cardiac  catheterization in 2002, history of mitral valve repair with mechanical valve, history of TVR with bioprosthetic valve, history of permanent atrial fibrillation on Coumadin, hypertension, diabetes, and history of venous insufficiency in the lower extremity. Her last echocardiogram on 04/21/2013 showed EF 60-65%, no regional wall motion abnormality, mildly to moderately calcified aortic valve with moderate AS and AR, well-seated mitral mechanical valve and tricuspid valve. Patient has been having some mild exertional shortness breath for the past year. She denies any chest pain, and states her shortness of breath is relieved with deep inspiration and Lasix. She underwent a stress test on 11/23/2013 which showed a small very mild partially reversible anteroseptal wall defect potentially due breast attenuation versus small area of ischemia, overall study was considered low risk, with EF 77%. She was last seen by Dr. Bronson Ing in the clinic on 7/29 at which time her atrial fibrillation was well controlled on Coumadin and Toprol-XL 50 mg daily. She was referred to see a vascular surgeon Dr. Bridgett Larsson for evaluation of her lower extremity venous insufficiency. However according to the patient, she had transportation issues and was not able to get to her vascular appointment recently. She denies any recent fever, chill, cough, or significant orthopnea. She has been taking her medication every single day. She recently had a fall and had some chipped tooth requiring dental procedure. She was seen by her primary care physician who instructed the patient to discontinue her Coumadin 5 days prior to the procedure and take Lovenox in the meantime instead. According to the patient, her last  dose of Coumadin was on 01/21/2014. She was supposed to take Lovenox after that, however she got her medication late therefore her last dose of Lovenox was actually in the morning of 01/25/2014 on the day of her dental procedure. She was told she  should have discontinue the Lovenox the night before, however her dentist eventually was willing to continue with the teeth extraction. She did receive antibiotics perioperatively. She had a good hemostasis afterward, however around 10 AM in the morning of 01/25/2014, she had bleeding from her gums. Bleeding continued despite ice and compression at home prompting the family member to take her to seek medical attention.  She presented to [facility] and was transferred to [facility] for dentist evaluation. On arrival to [facility], she was noted to be severely anemic with hemoglobin of 8 which continued to trend down to 7.1. Dentistry was involved who placed sutures to bleeding source to achieved good hemostasis. In the ED, patient was also noted to be tachycardic. EKG was obtained which showed atrial flutter with heart rate in the 120s. As for anemia, she eventually received 1 unit of blood transfusion. Cardiology was consulted for a flutter with RVR. Patient has since transitioned from lovenox to heparin gtt for easy reversibility and was admitted by internal medicine group for observation.   Inpatient Medications  . ALPRAZolam  0.5 mg Oral TID  . furosemide  40 mg Oral Daily  . insulin aspart  0-5 Units Subcutaneous QHS  . insulin aspart  0-9 Units Subcutaneous TID WC  . loratadine  10 mg Oral Daily  . metoprolol succinate  50 mg Oral Daily  . olopatadine  1 drop Both Eyes BID  . pantoprazole  40 mg Oral Daily  . polyethylene glycol  17 g Oral Daily  . potassium chloride  10 mEq Oral Daily  . simvastatin  40 mg Oral QHS  . sucralfate  1 g Oral TID  . traMADol-acetaminophen  2 tablet Oral TID    Family History Family History  Problem Relation Age of Onset  . Diabetes Mother   . CAD Mother   . Heart failure Father   . Stroke Sister 47     Social History History   Social History  . Marital Status: Married    Spouse Name: N/A    Number of Children: N/A  .  Years of Education: N/A   Occupational History  . Not on file.   Social History Main Topics  . Smoking status: Never Smoker   . Smokeless tobacco: Never Used  . Alcohol Use: No  . Drug Use: Not on file  . Sexual Activity: Not on file   Other Topics Concern  . Not on file   Social History Narrative  . No narrative on file     Review of Systems  General:  No chills, fever, night sweats or weight changes.  Cardiovascular:  No chest pain, edema, orthopnea, palpitations, paroxysmal nocturnal dyspnea. + dyspnea on exertion, no cardiac awareness for a-fib Dermatological: No rash, lesions/masses Respiratory: No cough, dyspnea Urologic: No hematuria, dysuria Abdominal:   No vomiting, diarrhea, bright red blood per rectum, melena, or hematemesis. Denies any bleeding issue prior to her dental procedure. Some nausea and weakness at [facility], resolved Neurologic:  No visual changes, changes in mental status. All other systems reviewed and are otherwise negative except as noted above.  Physical Exam  Blood pressure 108/53, pulse 117, temperature 98.4 F (36.9 C), temperature source Oral, resp. rate  18, height 5\' 2"  (1.575 m), weight 156 lb 14.4 oz (71.169 kg), SpO2 95.00%.  General: Pleasant, NAD Psych: Normal affect. Neuro: Alert and oriented X 3. Moves all extremities spontaneously. HEENT: Normal  Neck: Supple without bruits or JVD. Lungs:  Resp regular and unlabored, CTA. Heart: tachycardic no s3, s4, or murmurs. Abdomen: Soft, non-tender, BS + x 4. Mildly distended Extremities: No clubbing, cyanosis or edema. DP/PT/Radials 2+ and equal bilaterally.  Labs   Recent Labs  01/26/14 0730 01/26/14 1058  TROPONINI <0.30 <0.30   Lab Results  Component Value Date   WBC 4.9 01/26/2014   HGB 7.1* 01/26/2014   HCT 21.4* 01/26/2014   MCV 91.7 01/26/2014   PLT 123* 01/26/2014     Recent Labs Lab 01/25/14 2229  NA 142  K 4.4  CL 109  CO2 23  BUN 24*  CREATININE 1.17*  CALCIUM  8.3*  GLUCOSE 144*   No results found for this basename: CHOL,  HDL,  LDLCALC,  TRIG   Lab Results  Component Value Date   DDIMER <0.27 01/26/2014    Radiology/Studies  No results found.  ECG  2:1 a-flutter with HR 120s, TWI in anterolateral leads which appeared to be new when compared to previous EKG in 2014  [place]  1. A-flutter with RVR  - likely exacerbated by severe anemia  - focus on rate control given permanent a-fib, currently on 50mg  metoprolol XL, HR coming down to 90s after blood transfusion. Tele also shows patient is out of a-flutter and back in a-fib. Will continue current medication, if RVR recur, may consider digoxin vs make metoprolol XL 25mg  TID vs low dose amidarone PO.   - once low bleeding risk, possibly tomorrow, can restart coumadin, INR goal 2.5-3.5 with mechanical valve  - monitor for fever post dental procedure with mechanical valve, according to pt received abx    2. history of atypical chest pain with negative cardiac catheterization in 2002  - does have some TWI in anterolateral lead on EKG  - however low risk myoview recently (11/23/2013 which showed a small very mild partially reversible anteroseptal wall defect potentially due breast attenuation versus small area of ischemia, overall study was considered low risk, with EF 77%)  - echocardiogram on 04/21/2013 showed EF 60-65%, no regional wall motion abnormality, mildly to moderately calcified aortic valve with moderate AS and AR, well-seated mitral mechanical valve and tricuspid valve  - if TWI resolved after HR under control, with no CP, probably does not need further ischemic workup. ?if EKG change relate demand ischemia in the setting of severe anemia  3. history of mitral valve repair with mechanical valve 4. history of TVR with bioprosthetic valve 5. history of permanent atrial fibrillation on Coumadin 6. Hypertension 7. Diabetes 8. history of venous insufficiency in the lower  extremity 9. Dental bleeding: s/p suture with hemostasis 10. Severe anemia 11. CKD, Cr 1.17  Signed, Almyra Deforest, PA-C 01/26/2014, 2:56 PM   Agree with note by Almyra Deforest PA-C  ATSP with anemia, CAF on coumadin AC secondary to prosthetic valve s/p dental procedure with post op bleeding and anemia. Hx well out lined. Feeling better after Tx 1U PRBCs and HR appropriately decreased to 90 range with lateral ST depression (probably rate related). Denies CP. Exam notable for crisp VS. Lungs clear. On BB. Suspect increased HR secondary to anemia. Agree with Hep ---> coumadin AC. Home when INR > 2.5 with prosthetic valve. Follow HGB. May require additional unit depending  on HGB and HR.

## 2014-01-26 NOTE — Progress Notes (Signed)
ANTICOAGULATION CONSULT NOTE - Follow up  Pharmacy Consult for Heparin Indication: St. Jude MVR/Afib  Allergies  Allergen Reactions  . Oxycodone-Acetaminophen     REACTION: gi upset    Patient Measurements: Height: 5\' 2"  (157.5 cm) Weight: 156 lb 14.4 oz (71.169 kg) IBW/kg (Calculated) : 50.1 Heparin Dosing Weight: 65 kg  Vital Signs: Temp: 98.2 F (36.8 C) (08/19 2100) Temp src: Oral (08/19 2100) BP: 98/47 mmHg (08/19 2100) Pulse Rate: 105 (08/19 2100)  Labs:  Recent Labs  01/25/14 2229 01/26/14 0215 01/26/14 0730 01/26/14 1058 01/26/14 1905 01/26/14 1908  HGB 8.0* 8.1* 7.0* 7.1* 8.4*  --   HCT 24.4* 24.8* 22.0* 21.4* 25.0*  --   PLT 113* 133* 123*  --   --   --   LABPROT  --   --   --  16.0*  --   --   INR  --   --   --  1.28  --   --   HEPARINUNFRC  --   --   --   --   --  0.47  CREATININE 1.17*  --   --   --   --   --   TROPONINI  --   --  <0.30 <0.30 <0.30  --     Estimated Creatinine Clearance: 36.6 ml/min (by C-G formula based on Cr of 1.17).   Medical History: Past Medical History  Diagnosis Date  . Chronic atrial fibrillation     currently normal sinus rhythm  . Chronic anticoagulation   . Irritable bowel syndrome     constipation  . Atypical chest pain     Negative catheterization 2002, low risk Cardiolite study November 2011  . Moderate aortic stenosis by prior echocardiogram      asymptomatic  . Depression   . LV dysfunction     echocardiogram March 2013 by her primary care physician ejection fraction 40-45%, moderate aortic stenosis, prosthetic valves poorly reported , small pericardial effusion.  . Intermittent claudication     NL ABIs, 12/2011  . Myocardial infarction 1962  . DVT (deep venous thrombosis) 1962    LLE  . Heart murmur   . Type II diabetes mellitus   . Anemia   . History of blood transfusion 1971    "got real sick during pregnancy"  . GERD (gastroesophageal reflux disease)   . Migraine     "a few times/yr"  (01/26/2014)  . Arthritis     "knees, back" (01/26/2014)  . Osteoporosis   . Chronic lower back pain   . Anxiety   . Nephrolithiasis     "I have a large stone in my left kidney" (01/26/2014)  . Skin cancer     Assessment: Tonight the initial 8 hour heparin level is 0.47 on IV heparin rate 900 units/hr in this 70 YOF with hx St. Jude MVR and Afib. Dental bleeding: s/p suture with hemostasis.   Current heparin level is therapeutic on 900 units/hr. No further bleeding reported.  __  This patient is normally anticoagulated with warfarin PTA. The patient began holding warfarin starting on 8/15 for a dental procedure (tooth extraction) planned on 8/18. During this time the patient was bridged with Lovenox 60 mg bid - the patient's last dose PTA was on 8/18 at 0700.   The patient underwent the dental procedure on 8/18 AM with 4 teeth removed and had achieved hemostasis upon leaving the dental office. Later that afternoon, the patient presented to [facility] with oral bleeding.  The patient was given Vit K and transferred to [facility]. Oral surgery was consulted and hemostasis was once again achieved - the patient reported no bleeding this morning.   Pharmacy was consulted to start heparin for bridging this morning. It had been >24 hours since the patient's last lovenox dose. We did not bolus with heparin due to recent bleeding and decided to  aim for the lower end of goal range. Per discussion with MD, if patient tolerates heparin drip with no bleeding, to transition back to Enox/Warf on 8/20.   Goal of Therapy:  Heparin level 0.3-0.5 units/ml (lower goal due to recent oral bleeding) Monitor platelets by anticoagulation protocol: Yes   Plan:  Continue IV heparin drip at 900 units/hr (9 ml/hr) Daily heparin level  Will continue to monitor for any signs/symptoms of bleeding.  Nicole Cella, RPh Clinical Pharmacist Pager: [phone] 01/26/2014 9:34 PM

## 2014-01-26 NOTE — ED Notes (Signed)
Report attempted 

## 2014-01-26 NOTE — Progress Notes (Signed)
[facility] PROGRESS NOTE  Elizabeth Wallace FYB:[id number] DOB: Jul 19, 1933 DOA: 01/25/2014 PCP: Glenda Chroman., MD  Assessment/Plan: 80 y/o female with PMH of CAD, s/p MVR (mechanical valve) s/p TVR, CHF, A fib on coumadin, HTN, DM underwent dental extractions on 8/18 presented with post procedure bleeding; Per patient she stopped coumadin 8/14, and started taking lovenox BID  1. Acute blood loss anemia due to dental post procedure bleeding on AC -Tf 1 units; monitor Hg, TF prn; s/p dental suture per dental service;  -obtain INR, d/c lovenox; start IV heparin (if need to reverse); Pt needs AC due to mechanical valve    2. Tachycardia of unclear etiology; ? Hypovolemia due to blood loss with underlying a fib;  -denies chest pain, no SOB  -obtain ECG, trop prelim neg; pend echo  3. s/p MVR (mechanical valve) on AC (coumadin), s/p TVR  -stopped coumadin 8/14; currently on lovenox;  -d/c lovenox, start IV heparin if need to reverse; close monitor/reeval to resume coumadin in AM if no bleed 4. A fib chronic; on AC (as above) tachycardia; obtain ECG;  -resume BB, monitor  5. CAD; h/o LHC (negative 2002); denies acute chest pain; cont monitor  6. DM, hold PO meds; ISS 7. CHF, chronic; clinically euvolemic; resume home regimen ;      Code Status: full Family Communication: d/w patient (indicate person spoken with, relationship, and if by phone, the number) Disposition Plan: home pend clinical improvement    Consultants:  DDS  Procedures:  none  Antibiotics:  none (indicate start date, and stop date if known)  HPI/Subjective: alert  Objective: Filed Vitals:   01/26/14 0823  BP: 105/63  Pulse: 123  Temp: 98.2 F (36.8 C)  Resp: 18   No intake or output data in the 24 hours ending 01/26/14 0854 Filed Weights   01/26/14 0823  Weight: 71.169 kg (156 lb 14.4 oz)    Exam:   General:  alert  Cardiovascular: N2,D7 tachy, metallic sound   Respiratory: CTA  BL  Abdomen: soft, nt,nd   Musculoskeletal: mild edema   Data Reviewed: Basic Metabolic Panel:  Recent Labs Lab 01/25/14 2229  NA 142  K 4.4  CL 109  CO2 23  GLUCOSE 144*  BUN 24*  CREATININE 1.17*  CALCIUM 8.3*   Liver Function Tests: No results found for this basename: AST, ALT, ALKPHOS, BILITOT, PROT, ALBUMIN,  in the last 168 hours No results found for this basename: LIPASE, AMYLASE,  in the last 168 hours No results found for this basename: AMMONIA,  in the last 168 hours CBC:  Recent Labs Lab 01/25/14 2229 01/26/14 0215 01/26/14 0730  WBC 5.9 6.5 4.9  NEUTROABS 5.0  --   --   HGB 8.0* 8.1* 7.0*  HCT 24.4* 24.8* 22.0*  MCV 91.4 91.2 91.7  PLT 113* 133* 123*   Cardiac Enzymes:  Recent Labs Lab 01/26/14 0730  TROPONINI <0.30   BNP (last 3 results) No results found for this basename: PROBNP,  in the last 8760 hours CBG:  Recent Labs Lab 01/26/14 0143 01/26/14 0829  GLUCAP 138* 160*    No results found for this or any previous visit (from the past 240 hour(s)).   Studies: No results found.  Scheduled Meds: . ALPRAZolam  0.5 mg Oral TID  . enoxaparin  60 mg Subcutaneous Q24H  . furosemide  40 mg Oral Daily  . glipiZIDE  2.5 mg Oral Daily  . insulin aspart  0-5 Units Subcutaneous QHS  .  insulin aspart  0-9 Units Subcutaneous TID WC  . loratadine  10 mg Oral Daily  . metoprolol succinate  50 mg Oral Daily  . olopatadine  1 drop Both Eyes BID  . pantoprazole  40 mg Oral Daily  . polyethylene glycol  17 g Oral Daily  . potassium chloride  10 mEq Oral Daily  . raloxifene  60 mg Oral Daily  . simvastatin  40 mg Oral QHS  . sucralfate  1 g Oral TID  . traMADol-acetaminophen  2 tablet Oral TID   Continuous Infusions: . sodium chloride      Active Problems:   Bleeding   Bleeding gums    Time spent: >35 minutes     Kinnie Feil  [facility] Pager [phone]. If 7PM-7AM, please contact night-coverage at [email or web address],  password [facility] 01/26/2014, 8:54 AM  LOS: 1 day

## 2014-01-26 NOTE — Consult Note (Signed)
Reason for Consult:post-op bleeding Referring Physician: ER  Elizabeth Wallace is an 80 y.o. female.  YH:CWCBJSE d/c'd coumadin per MD 5 days prior to dental extractions with local anesthesia earlier today. Was hemostatic at D/C from office. Began bleeding around 10am. No hemostasis obtained with normal pressure, ice, tea bags. Presented at [facility] ED and bleeding continued. Transferred to [facility] for further management. Patient states she has been taking Lovenox and did in fact take it this morning.   HPI:   Past Medical History  Diagnosis Date  . History of mitral valve replacement with mechanical valve      St. Jude's bileaflet prosthesis  . S/P tricuspid valve replacement      bioprosthetic valve  . Chronic atrial fibrillation     currently normal sinus rhythm  . S/P patent foramen ovale closure   . Chronic anticoagulation   . Nephrolithiasis   . Irritable bowel syndrome     constipation  . Atypical chest pain     Negative catheterization 2002, low risk Cardiolite study November 2011  . Diabetes mellitus   . Moderate aortic stenosis by prior echocardiogram      asymptomatic  . Depression   . LV dysfunction     echocardiogram March 2013 by her primary care physician ejection fraction 40-45%, moderate aortic stenosis, prosthetic valves poorly reported , small pericardial effusion.  . Intermittent claudication     NL ABIs, 12/2011    Past Surgical History  Procedure Laterality Date  . Tooth extraction  01/25/2014    History reviewed. No pertinent family history.  Social History:  reports that she has never smoked. She has never used smokeless tobacco. She reports that she does not drink alcohol. Her drug history is not on file.  Allergies:  Allergies  Allergen Reactions  . Oxycodone-Acetaminophen     REACTION: gi upset    Medications: I have reviewed the patient's current medications.  Results for orders placed during the hospital encounter of 01/25/14 (from the past 48  hour(s))  CBC WITH DIFFERENTIAL     Status: Abnormal   Collection Time    01/25/14 10:29 PM      Result Value Ref Range   WBC 5.9  4.0 - 10.5 K/uL   RBC 2.67 (*) 3.87 - 5.11 MIL/uL   Hemoglobin 8.0 (*) 12.0 - 15.0 g/dL   HCT 24.4 (*) 36.0 - 46.0 %   MCV 91.4  78.0 - 100.0 fL   MCH 30.0  26.0 - 34.0 pg   MCHC 32.8  30.0 - 36.0 g/dL   RDW 14.7  11.5 - 15.5 %   Platelets 113 (*) 150 - 400 K/uL   Comment: REPEATED TO VERIFY     SPECIMEN CHECKED FOR CLOTS     PLATELET COUNT CONFIRMED BY SMEAR   Neutrophils Relative % 85 (*) 43 - 77 %   Neutro Abs 5.0  1.7 - 7.7 K/uL   Lymphocytes Relative 10 (*) 12 - 46 %   Lymphs Abs 0.6 (*) 0.7 - 4.0 K/uL   Monocytes Relative 4  3 - 12 %   Monocytes Absolute 0.3  0.1 - 1.0 K/uL   Eosinophils Relative 0  0 - 5 %   Eosinophils Absolute 0.0  0.0 - 0.7 K/uL   Basophils Relative 0  0 - 1 %   Basophils Absolute 0.0  0.0 - 0.1 K/uL  BASIC METABOLIC PANEL     Status: Abnormal   Collection Time    01/25/14 10:29 PM  Result Value Ref Range   Sodium 142  137 - 147 mEq/L   Potassium 4.4  3.7 - 5.3 mEq/L   Chloride 109  96 - 112 mEq/L   CO2 23  19 - 32 mEq/L   Glucose, Bld 144 (*) 70 - 99 mg/dL   BUN 24 (*) 6 - 23 mg/dL   Creatinine, Ser 1.17 (*) 0.50 - 1.10 mg/dL   Calcium 8.3 (*) 8.4 - 10.5 mg/dL   GFR calc non Af Amer 43 (*) >90 mL/min   GFR calc Af Amer 50 (*) >90 mL/min   Comment: (NOTE)     The eGFR has been calculated using the CKD EPI equation.     This calculation has not been validated in all clinical situations.     eGFR's persistently <90 mL/min signify possible Chronic Kidney     Disease.   Anion gap 10  5 - 15  TYPE AND SCREEN     Status: None   Collection Time    01/25/14 11:10 PM      Result Value Ref Range   ABO/RH(D) B POS     Antibody Screen PENDING     Sample Expiration 01/28/2014    PREPARE RBC (CROSSMATCH)     Status: None   Collection Time    01/25/14 11:10 PM      Result Value Ref Range   Order Confirmation ORDER  PROCESSED BY BLOOD BANK      No results found.  ROS Blood pressure 98/68, pulse 123, temperature 98 F (36.7 C), temperature source Oral, resp. rate 15, SpO2 100.00%. General appearance: alert, cooperative and mild distress Throat: Extraction sites teeth #'s 8, 9, 13, and 28 were slowly oozing at time of exam in ER. Collected blood orally. No spontaneous hemmorhage from other sites. Pharynx clear. No edema orally.  Assessment/Plan: Post-op bleeding. Sockets anesthesized with 4% Septocaine 1:100,000 epi. Gently suctioned immature clots. Surgicel placed in sockets x 4. Sutured with 4-0 chromic. Tolerated procedure well. Hemostatic. Patient to follow with MD in [facility] or possibly admitted to Medicine service per ER MD. Gae Bon 01/26/2014, 12:03 AM

## 2014-01-26 NOTE — ED Notes (Signed)
Pt walked on the hallway as ordered by MD, pt denies any dizziness or SOB, O2 saturation 96% on RA while walking and HR 123.

## 2014-01-26 NOTE — ED Notes (Signed)
Report given to floor, RN wants to know why blood wasn't given. I was not given report the patient needed blood. Signed and held orders not released by previous RN. Patient remains stable, will transport to ready bed.

## 2014-01-27 DIAGNOSIS — I5032 Chronic diastolic (congestive) heart failure: Secondary | ICD-10-CM

## 2014-01-27 DIAGNOSIS — Z0389 Encounter for observation for other suspected diseases and conditions ruled out: Secondary | ICD-10-CM

## 2014-01-27 DIAGNOSIS — Z7901 Long term (current) use of anticoagulants: Secondary | ICD-10-CM

## 2014-01-27 DIAGNOSIS — IMO0001 Reserved for inherently not codable concepts without codable children: Secondary | ICD-10-CM

## 2014-01-27 LAB — CBC
HCT: 25.9 % — ABNORMAL LOW (ref 36.0–46.0)
Hemoglobin: 8.5 g/dL — ABNORMAL LOW (ref 12.0–15.0)
MCH: 30.6 pg (ref 26.0–34.0)
MCHC: 32.8 g/dL (ref 30.0–36.0)
MCV: 93.2 fL (ref 78.0–100.0)
Platelets: 99 10*3/uL — ABNORMAL LOW (ref 150–400)
RBC: 2.78 MIL/uL — ABNORMAL LOW (ref 3.87–5.11)
RDW: 15.1 % (ref 11.5–15.5)
WBC: 4.4 10*3/uL (ref 4.0–10.5)

## 2014-01-27 LAB — GLUCOSE, CAPILLARY
GLUCOSE-CAPILLARY: 181 mg/dL — AB (ref 70–99)
Glucose-Capillary: 136 mg/dL — ABNORMAL HIGH (ref 70–99)
Glucose-Capillary: 164 mg/dL — ABNORMAL HIGH (ref 70–99)

## 2014-01-27 LAB — HEPARIN LEVEL (UNFRACTIONATED): HEPARIN UNFRACTIONATED: 0.52 [IU]/mL (ref 0.30–0.70)

## 2014-01-27 LAB — HEMOGLOBIN AND HEMATOCRIT, BLOOD
HEMATOCRIT: 25.9 % — AB (ref 36.0–46.0)
Hemoglobin: 8.4 g/dL — ABNORMAL LOW (ref 12.0–15.0)

## 2014-01-27 MED ORDER — FUROSEMIDE 20 MG PO TABS
20.0000 mg | ORAL_TABLET | Freq: Every day | ORAL | Status: DC
  Administered 2014-01-27 – 2014-01-28 (×2): 20 mg via ORAL
  Filled 2014-01-27 (×2): qty 1

## 2014-01-27 MED ORDER — WARFARIN SODIUM 7.5 MG PO TABS
7.5000 mg | ORAL_TABLET | Freq: Once | ORAL | Status: AC
  Administered 2014-01-27: 7.5 mg via ORAL
  Filled 2014-01-27: qty 1

## 2014-01-27 MED ORDER — ENOXAPARIN SODIUM 80 MG/0.8ML ~~LOC~~ SOLN
70.0000 mg | Freq: Two times a day (BID) | SUBCUTANEOUS | Status: DC
  Administered 2014-01-27 – 2014-01-29 (×5): 70 mg via SUBCUTANEOUS
  Filled 2014-01-27 (×7): qty 0.8

## 2014-01-27 MED ORDER — METOPROLOL SUCCINATE ER 50 MG PO TB24
75.0000 mg | ORAL_TABLET | Freq: Every day | ORAL | Status: DC
  Administered 2014-01-27 – 2014-01-30 (×4): 75 mg via ORAL
  Filled 2014-01-27 (×5): qty 1

## 2014-01-27 MED ORDER — WARFARIN - PHARMACIST DOSING INPATIENT
Freq: Every day | Status: DC
  Administered 2014-01-27: 18:00:00

## 2014-01-27 NOTE — Progress Notes (Signed)
INITIAL NUTRITION ASSESSMENT  DOCUMENTATION CODES Per approved criteria  -Not Applicable   INTERVENTION: Encouraged PO intake.  Will monitor for weight status changes. Currently eating 50-75% of meals.  NUTRITION DIAGNOSIS: No nutrition diagnosis at this time.    Goal: Pt to meet >/= 90% of their estimated nutrition needs   Monitor:  Weight trends, PO intake, glucose levels, labs, I/O's  Reason for Assessment: Pt identified as at nutrition risk on the Malnutrition Screen Tool   80 y.o. female  Admitting Dx: Bleeding gums  ASSESSMENT: 80 y/o female with PMH of CAD, s/p MVR (mechanical valve) s/p TVR, CHF, A fib on coumadin, HTN, DM underwent dental extractions on 8/18 presented with post procedure bleeding.Per patient she stopped coumadin 8/14.  Pt currently on mod carb diet. Pt reports wt loss of up to 6 lbs in the past 6 months with decreased appetite, says she is unsure of why her appetite has changed. UBW: 158 lb, weight stable. Pt reports having diabetes for 10 years, when asked if she follows a DM diet at home,she says she "tried to watch her sugar" but is not controlling her blood sugar as well as she should. Pt had multiple tooth extractions 8/18, which has made it difficult for her to eat but she states that she is able to eat over 50% of her meals. Pt is not concerned about recent wt loss. Suggested sugar-free carnation instant breakfast if she starts to have eating difficulties at home considering her recent tooth extractions. States she does not like Ensure supplements.   Nutrition Focused Physical Exam:  Subcutaneous Fat:  Orbital Region: WNL Upper Arm Region: WNL Thoracic and Lumbar Region: WNL  Muscle:  Temple Region: WNL Clavicle Bone Region: WNL Clavicle and Acromion Bone Region: WNL Scapular Bone Region: N/A Dorsal Hand: WNL Patellar Region: swelling Anterior Thigh Region: WNL Posterior Calf Region: WNL  Edema: noted mild  No muscle or subcutaneous  fat depletion noticed.  Labs reviewed: Glucose: 181 BUN high Cre high Hgb low   Height: Ht Readings from Last 1 Encounters:  01/26/14 5\' 2"  (1.575 m)    Weight: Wt Readings from Last 1 Encounters:  01/26/14 156 lb 14.4 oz (71.169 kg)    Ideal Body Weight: 110 lb  % Ideal Body Weight: 141%  Wt Readings from Last 10 Encounters:  01/26/14 156 lb 14.4 oz (71.169 kg)  01/05/14 151 lb (68.493 kg)  11/03/13 152 lb (68.947 kg)  05/10/13 149 lb (67.586 kg)  04/20/13 151 lb (68.493 kg)  12/28/12 150 lb 1.9 oz (68.094 kg)  10/22/12 154 lb 1.9 oz (69.908 kg)  12/06/11 158 lb 12.8 oz (72.031 kg)  11/06/11 151 lb (68.493 kg)  11/06/11 151 lb (68.493 kg)    Usual Body Weight: 158 lb  % Usual Body Weight: 99%  BMI:  Body mass index is 28.69 kg/(m^2).  Estimated Nutritional Needs: Kcal: 1550-1700 Protein: 85-95g Fluid: 1.5 L/d  Skin: intact, noted mild edema  Diet Order: Carb Control  EDUCATION NEEDS: -Education not appropriate at this time   Intake/Output Summary (Last 24 hours) at 01/27/14 1336 Last data filed at 01/27/14 1200  Gross per 24 hour  Intake 1652.27 ml  Output    950 ml  Net 702.27 ml    Last BM: none documented   Labs:   Recent Labs Lab 01/25/14 2229  NA 142  K 4.4  CL 109  CO2 23  BUN 24*  CREATININE 1.17*  CALCIUM 8.3*  GLUCOSE 144*  CBG (last 3)   Recent Labs  01/26/14 2232 01/27/14 0744 01/27/14 1151  GLUCAP 144* 136* 181*    Scheduled Meds: . ALPRAZolam  0.5 mg Oral TID  . enoxaparin (LOVENOX) injection  70 mg Subcutaneous Q12H  . furosemide  20 mg Oral Daily  . insulin aspart  0-5 Units Subcutaneous QHS  . insulin aspart  0-9 Units Subcutaneous TID WC  . loratadine  10 mg Oral Daily  . metoprolol succinate  75 mg Oral Daily  . olopatadine  1 drop Both Eyes BID  . pantoprazole  40 mg Oral Daily  . polyethylene glycol  17 g Oral Daily  . potassium chloride  10 mEq Oral Daily  . simvastatin  40 mg Oral QHS  .  sucralfate  1 g Oral TID  . traMADol-acetaminophen  2 tablet Oral TID  . warfarin  7.5 mg Oral ONCE-1800  . Warfarin - Pharmacist Dosing Inpatient   Does not apply q1800    Continuous Infusions:   Past Medical History  Diagnosis Date  . Chronic atrial fibrillation     currently normal sinus rhythm  . Chronic anticoagulation   . Irritable bowel syndrome     constipation  . Atypical chest pain     Negative catheterization 2002, low risk Cardiolite study November 2011  . Moderate aortic stenosis by prior echocardiogram      asymptomatic  . Depression   . LV dysfunction     echocardiogram March 2013 by her primary care physician ejection fraction 40-45%, moderate aortic stenosis, prosthetic valves poorly reported , small pericardial effusion.  . Intermittent claudication     NL ABIs, 12/2011  . Myocardial infarction 1962  . DVT (deep venous thrombosis) 1962    LLE  . Heart murmur   . Type II diabetes mellitus   . Anemia   . History of blood transfusion 1971    "got real sick during pregnancy"  . GERD (gastroesophageal reflux disease)   . Migraine     "a few times/yr" (01/26/2014)  . Arthritis     "knees, back" (01/26/2014)  . Osteoporosis   . Chronic lower back pain   . Anxiety   . Nephrolithiasis     "I have a large stone in my left kidney" (01/26/2014)  . Skin cancer     Past Surgical History  Procedure Laterality Date  . Multiple tooth extractions  01/25/2014    "had 4 pulled"  . Cholecystectomy    . Appendectomy    . Hernia repair    . Umbilical hernia repair    . Tricuspid valve replacement  10/2000    bioprosthetic valve  . Patent foramen ovale closure  1971  . Mitral valve replacement  10/2000     St. Jude's bileaflet prosthesis  . Cardiac catheterization  ~ 3 times  . Skin cancer excision      "off my nose"  . Abdominal hysterectomy  1970's    Clayton Bibles, [place], [place] Licensed Dietitian Nutritionist Pager: [phone]

## 2014-01-27 NOTE — Care Management Note (Signed)
    Page 1 of 1   01/30/2014     3:53:26 PM CARE MANAGEMENT NOTE 01/30/2014  Patient:  Elizabeth Wallace, Elizabeth Wallace   Account Number:  [id number]  Date Initiated:  01/27/2014  Documentation initiated by:  Magdalen Spatz  Subjective/Objective Assessment:     Action/Plan:   Anticipated DC Date:  01/27/2014   Anticipated DC Plan:  [place]         Choice offered to / List presented to:  C-1 Patient        [place] arranged  HH-1 RN  [place]      [place] agency  [place].   Status of service:  Completed, signed off Medicare Important Message given?  YES (If response is "NO", the following Medicare IM given date fields will be blank) Date Medicare IM given:  01/27/2014 Medicare IM given by:  Magdalen Spatz Date Additional Medicare IM given:   Additional Medicare IM given by:    Discharge Disposition:  [place]  Per UR Regulation:    If discussed at Long Length of Stay Meetings, dates discussed:    Comments:  01/30/14 15:45 Cm notified [facility] rep, Winnie of discharge.  No other CM needs were communicated.  Mariane Masters, BSN, [place].  01-27-14 Patient's address [address] [place] , [place] , phone [phone].  Patient does not have blood sugar machine . She states she will get one at [facility] .  Patient requesting HHRN to help her set up medication box .  Magdalen Spatz RN BSN [phone]

## 2014-01-27 NOTE — Progress Notes (Addendum)
Subjective:  Sore but no SOB, some vague Lt chest pain.  Objective:  Vital Signs in the last 24 hours: Temp:  [98.1 F (36.7 C)-98.5 F (36.9 C)] 98.2 F (36.8 C) (08/20 0527) Pulse Rate:  [105-125] 116 (08/20 0527) Resp:  [18] 18 (08/19 2100) BP: (90-119)/(39-82) 95/50 mmHg (08/20 0527) SpO2:  [92 %-95 %] 92 % (08/20 0527)  Intake/Output from previous day:  Intake/Output Summary (Last 24 hours) at 01/27/14 1026 Last data filed at 01/27/14 0906  Gross per 24 hour  Intake 1652.27 ml  Output    350 ml  Net 1302.27 ml    Physical Exam: General appearance: alert, cooperative, no distress, mildly obese and pale Lungs: clear to auscultation bilaterally Heart: irregularly irregular rhythm and positive valve sounds   Rate: 120  Rhythm: atrial fibrillation and atrial flutter  Lab Results:  Recent Labs  01/26/14 0730  01/27/14 0047 01/27/14 0406  WBC 4.9  --   --  4.4  HGB 7.0*  < > 8.4* 8.5*  PLT 123*  --   --  99*  < > = values in this interval not displayed.  Recent Labs  01/25/14 2229  NA 142  K 4.4  CL 109  CO2 23  GLUCOSE 144*  BUN 24*  CREATININE 1.17*    Recent Labs  01/26/14 1058 01/26/14 1905  TROPONINI <0.30 <0.30    Recent Labs  01/26/14 1058  INR 1.28    Imaging: No results found.  Cardiac Studies: Echo 01/26/14 Study Conclusions  - Left ventricle: The cavity size was normal. Systolic function was normal. The estimated ejection fraction was in the range of 55% to 60%. Wall motion was normal; there were no regional wall motion abnormalities. - Aortic valve: Severe thickening and calcification, consistent with sclerosis. Valve mobility was restricted. There was mild stenosis. There was moderate regurgitation. Valve area (VTI): 1.26 cm^2. Valve area (Vmax): 1.24 cm^2. Valve area (Vmean): 1.35 cm^2. - Mitral valve: A mechanical prosthesis was present. Regurgitation could not be evaluated due to acoustic artifact from  the prosthesis. Valve area by continuity equation (using LVOT flow): 2.3 cm^2. - Right ventricle: The cavity size was moderately dilated. Wall thickness was normal. Systolic function was moderately reduced. - Tricuspid valve: Poorly visualized s/p TV replacement   Assessment/Plan:  80 year old female, followed by Dr Bronson Ing with PMH of chest pain, negative cardiac catheterization in May 2013, history of mitral valve replacement with mechanical valve, and TVR with bioprosthetic valve in 2002. She has a history of permanent atrial fibrillation on Coumadin, hypertension, diabetes, and history of venous insufficiency in the lower extremity presented as a transfer from [facility] 01/25/14 with gum bleeding after dental procedure 8/18. She was noted to be anemic (hgb 7), and to be in a-flutter with RVR.  She had been crossed over pre procedure with Lovenox and had resumed her Lovenox post dental extraction. She was given Vit K at [facility].     Principal Problem:   Bleeding gums after dental procedure Active Problems:   Blood loss anemia- tranfused   History tricuspid valve replacement with bioprosthetic valve   History of mitral valve replacement with mechanical valve   Chronic anticoagulation- INR goal 2.5-3.5   Normal coronary arteries May 2013/ Normal LVF echo Aug 2015   Chronic atrial fibrillation    PLAN: Telemetry does look like AF and A flutter. B/P is low- limiting medical Rx of her HR. Toprol has been increased to 75 mg daily. She has  been transfused. Will follow, it may take some time for her INR to come back up after receiving Vit K. She is currently on full dose Lovenox and Coumadin per pharmacy. INR goal 2.5-3.5  Kerin Ransom PA-C Beeper [phone] 01/27/2014, 10:26 AM   Personally seen and examined. Agree with above. Overall reasonable rate control.  Await INR 2.5 If Hgb stable, would not be unreasonable to DC home tomorrow with close follow up.    Candee Furbish,  MD

## 2014-01-27 NOTE — Progress Notes (Signed)
Went by to speak with patient about diabetes and home regimen for diabetes control. Patient states that she is followed by Dr. Woody Seller for diabetes management and she takes Glipizide 2.5 mg daily for diabetes control as an outpatient. Praised patient for A1C of 6.5% and encouraged to continue to keep diabetes controlled. In talking with the patient she reported that she felt depressed and was tearful at times. Conducted PHQ-9 screening and patient scored 21 which is indicative of depression. Patient states that her PCP has prescribed her a medication for depression but she is afraid to take it since she was already on so many medications. Encouraged patient to depression medication she was prescribed by her PCP. Asked patient if I could consult a Education officer, museum for her to talk with and patient is agreeable. Placed consult for SW. Will continue to follow for glycemic control while inpatient.  Thanks, Barnie Alderman, RN, MSN, CCRN Diabetes Coordinator Inpatient Diabetes Program [phone] (Team Pager) [phone] (AP office) [phone] [facility] office)

## 2014-01-27 NOTE — Clinical Documentation Improvement (Signed)
Possible Clinical Conditions?  Chronic Systolic Congestive Heart Failure Chronic Diastolic Congestive Heart Failure Chronic Systolic & Diastolic Congestive Heart Failure Acute on Chronic Systolic Congestive Heart Failure Acute on Chronic Diastolic Congestive Heart Failure Acute on Chronic Systolic & Diastolic Congestive Heart Failure Other Condition Cannot Clinically Determine   Risk Factors: CHF, chronic, clinically euvolemic per 8/19 progress notes. Treatment: Home medication list includes lasix 40mg  po qday.   Thank You, Theron Arista, Clinical Documentation Specialist:  [phone]  [facility] Information Management

## 2014-01-27 NOTE — Progress Notes (Signed)
A consult was issued for a visit from a chaplain. I visited Elizabeth Wallace on this date. She requested prayer for her children and extended family that they would accept Christ as their savior. Elizabeth Wallace also requested prayer for her healing and release from the hospital. Chaplain provided prayer and a listening ear.  Charyl Dancer, Chaplain  01/27/14 1000  Clinical Encounter Type  Visited With Patient  Visit Type Spiritual support  Referral From Patient  Consult/Referral To Chaplain  Spiritual Encounters  Spiritual Needs Prayer;Emotional  Stress Factors  Patient Stress Factors None identified  Family Stress Factors Family relationships

## 2014-01-27 NOTE — Progress Notes (Addendum)
[facility] PROGRESS NOTE  Elizabeth Wallace RSW:[id number] DOB: Sep 07, 1933 DOA: 01/25/2014 PCP: Glenda Chroman., MD  Assessment/Plan: 80 y/o female with PMH of CAD, s/p MVR (mechanical valve) s/p TVR, CHF, A fib on coumadin, HTN, DM underwent dental extractions on 8/18 presented with post procedure bleeding; Per patient she stopped coumadin 8/14, and started taking lovenox BID  1. Acute blood loss anemia due to dental post procedure bleeding on AC;  -Hg improved s/p Tf 1 units; no new s/s of bleeding; monitor Hg, TF prn; s/p dental suture per dental service;  -Pt was on temporary  IV heparin; change to SQ lovenox+coumadin   2. A fib RVR uncontrolled; likely due to Hypovolemia due to blood loss  -denies chest pain, no SOB trop prelim neg; echo: LVEF 55% Wall motion was normal; there were no regional wall motion abnormalities. -increase BB to 75 Mg 8/20; appreciate cardiology input; decrease lasix, clinically nto fluid overloaded  3. s/p MVR (mechanical valve) on AC (coumadin), s/p TVR  -stopped coumadin 8/14; resume lovenox+coumadin  4. CAD; h/o LHC (negative 2002); denies acute chest pain; cont monitor  5. DM, hold PO meds; ISS 6. CHF, chronic; clinically euvolemic; resume home regimen ;  7.  S/p dental procedure; Pt received atx prophylaxis   Possible d/c home today   Code Status: full Family Communication: d/w patient (indicate person spoken with, relationship, and if by phone, the number) Disposition Plan: home pend clinical improvement    Consultants:  DDS  Procedures:  none  Antibiotics:  none (indicate start date, and stop date if known)  HPI/Subjective: alert  Objective: Filed Vitals:   01/27/14 0527  BP: 95/50  Pulse: 116  Temp: 98.2 F (36.8 C)  Resp:     Intake/Output Summary (Last 24 hours) at 01/27/14 0753 Last data filed at 01/27/14 0600  Gross per 24 hour  Intake 1292.27 ml  Output    350 ml  Net 942.27 ml   Filed Weights   01/26/14 0823   Weight: 71.169 kg (156 lb 14.4 oz)    Exam:   General:  alert  Cardiovascular: K9,F8 tachy, metallic sound   Respiratory: CTA BL  Abdomen: soft, nt,nd   Musculoskeletal: mild edema   Data Reviewed: Basic Metabolic Panel:  Recent Labs Lab 01/25/14 2229  NA 142  K 4.4  CL 109  CO2 23  GLUCOSE 144*  BUN 24*  CREATININE 1.17*  CALCIUM 8.3*   Liver Function Tests: No results found for this basename: AST, ALT, ALKPHOS, BILITOT, PROT, ALBUMIN,  in the last 168 hours No results found for this basename: LIPASE, AMYLASE,  in the last 168 hours No results found for this basename: AMMONIA,  in the last 168 hours CBC:  Recent Labs Lab 01/25/14 2229 01/26/14 0215 01/26/14 0730 01/26/14 1058 01/26/14 1905 01/27/14 0047 01/27/14 0406  WBC 5.9 6.5 4.9  --   --   --  4.4  NEUTROABS 5.0  --   --   --   --   --   --   HGB 8.0* 8.1* 7.0* 7.1* 8.4* 8.4* 8.5*  HCT 24.4* 24.8* 22.0* 21.4* 25.0* 25.9* 25.9*  MCV 91.4 91.2 91.7  --   --   --  93.2  PLT 113* 133* 123*  --   --   --  99*   Cardiac Enzymes:  Recent Labs Lab 01/26/14 0730 01/26/14 1058 01/26/14 1905  TROPONINI <0.30 <0.30 <0.30   BNP (last 3 results) No results found for this  basename: PROBNP,  in the last 8760 hours CBG:  Recent Labs Lab 01/26/14 0829 01/26/14 1213 01/26/14 1705 01/26/14 2232 01/27/14 0744  GLUCAP 160* 190* 161* 144* 136*    No results found for this or any previous visit (from the past 240 hour(s)).   Studies: No results found.  Scheduled Meds: . ALPRAZolam  0.5 mg Oral TID  . furosemide  40 mg Oral Daily  . insulin aspart  0-5 Units Subcutaneous QHS  . insulin aspart  0-9 Units Subcutaneous TID WC  . loratadine  10 mg Oral Daily  . metoprolol succinate  75 mg Oral Daily  . olopatadine  1 drop Both Eyes BID  . pantoprazole  40 mg Oral Daily  . polyethylene glycol  17 g Oral Daily  . potassium chloride  10 mEq Oral Daily  . simvastatin  40 mg Oral QHS  . sucralfate  1  g Oral TID  . traMADol-acetaminophen  2 tablet Oral TID   Continuous Infusions: . heparin 900 Units/hr (01/26/14 1116)    Active Problems:   Bleeding   Bleeding gums    Time spent: >35 minutes     Kinnie Feil  [facility] Pager [phone]. If 7PM-7AM, please contact night-coverage at [email or web address], password [facility] 01/27/2014, 7:53 AM  LOS: 2 days

## 2014-01-27 NOTE — Progress Notes (Signed)
ANTICOAGULATION CONSULT NOTE - Initial Consult  Pharmacy Consult for Heparin Indication: St. Jude MVR/Afib  Allergies  Allergen Reactions  . Oxycodone-Acetaminophen     REACTION: gi upset    Patient Measurements: Height: 5\' 2"  (157.5 cm) Weight: 156 lb 14.4 oz (71.169 kg) IBW/kg (Calculated) : 50.1   Vital Signs: Temp: 98.2 F (36.8 C) (08/20 0527) Temp src: Oral (08/20 0527) BP: 95/50 mmHg (08/20 0527) Pulse Rate: 116 (08/20 0527)  Labs:  Recent Labs  01/25/14 2229 01/26/14 0215 01/26/14 0730 01/26/14 1058 01/26/14 1905 01/26/14 1908 01/27/14 0047 01/27/14 0404 01/27/14 0406  HGB 8.0* 8.1* 7.0* 7.1* 8.4*  --  8.4*  --  8.5*  HCT 24.4* 24.8* 22.0* 21.4* 25.0*  --  25.9*  --  25.9*  PLT 113* 133* 123*  --   --   --   --   --  99*  LABPROT  --   --   --  16.0*  --   --   --   --   --   INR  --   --   --  1.28  --   --   --   --   --   HEPARINUNFRC  --   --   --   --   --  0.47  --  0.52  --   CREATININE 1.17*  --   --   --   --   --   --   --   --   TROPONINI  --   --  <0.30 <0.30 <0.30  --   --   --   --     Estimated Creatinine Clearance: 36.6 ml/min (by C-G formula based on Cr of 1.17).   Assessment: 89 YOF with hx St. Jude MVR and Afib normally anticoagulated with warfarin PTA. The patient began holding warfarin starting on 8/15 for a dental procedure (tooth extraction) planned on 8/18. During this time the patient was bridged with Lovenox 60 mg bid - the patient's last dose PTA was on 8/18 at 0700.   The patient underwent the dental procedure on 8/18 AM with 4 teeth removed and had achieved hemostasis upon leaving the dental office. Later that afternoon, the patient presented to [facility] with oral bleeding. The patient was given Vit K and transferred to [facility]. Oral surgery was consulted and hemostasis was once again achieved.  Pharmacy was consulted to start heparin on 8/19.  Transitioning to Lovenox/warfarin today.    Goal of Therapy:  Therapeutic  anticoagulation with Lovenox INR 2.5-3.5   Plan:  1. Stop heparin drip and start Lovenox 70mg  sq q12 2. Warfarin 7.5mg  po x 1 dose 3. Daily Protimes  Heide Guile, PharmD, BCPS Clinical Pharmacist Pager [phone]   01/27/2014 8:14 AM

## 2014-01-27 NOTE — Progress Notes (Signed)
ANTICOAGULATION CONSULT NOTE - Follow up  Pharmacy Consult for Heparin Indication: St. Jude MVR/Afib  Allergies  Allergen Reactions  . Oxycodone-Acetaminophen     REACTION: gi upset    Patient Measurements: Height: 5\' 2"  (157.5 cm) Weight: 156 lb 14.4 oz (71.169 kg) IBW/kg (Calculated) : 50.1 Heparin Dosing Weight: 65 kg  Vital Signs: Temp: 98.2 F (36.8 C) (08/19 2100) Temp src: Oral (08/19 2100) BP: 98/47 mmHg (08/19 2100) Pulse Rate: 105 (08/19 2100)  Labs:  Recent Labs  01/25/14 2229 01/26/14 0215 01/26/14 0730 01/26/14 1058 01/26/14 1905 01/26/14 1908 01/27/14 0047 01/27/14 0404  HGB 8.0* 8.1* 7.0* 7.1* 8.4*  --  8.4*  --   HCT 24.4* 24.8* 22.0* 21.4* 25.0*  --  25.9*  --   PLT 113* 133* 123*  --   --   --   --   --   LABPROT  --   --   --  16.0*  --   --   --   --   INR  --   --   --  1.28  --   --   --   --   HEPARINUNFRC  --   --   --   --   --  0.47  --  0.52  CREATININE 1.17*  --   --   --   --   --   --   --   TROPONINI  --   --  <0.30 <0.30 <0.30  --   --   --     Estimated Creatinine Clearance: 36.6 ml/min (by C-G formula based on Cr of 1.17).   Medical History: Past Medical History  Diagnosis Date  . Chronic atrial fibrillation     currently normal sinus rhythm  . Chronic anticoagulation   . Irritable bowel syndrome     constipation  . Atypical chest pain     Negative catheterization 2002, low risk Cardiolite study November 2011  . Moderate aortic stenosis by prior echocardiogram      asymptomatic  . Depression   . LV dysfunction     echocardiogram March 2013 by her primary care physician ejection fraction 40-45%, moderate aortic stenosis, prosthetic valves poorly reported , small pericardial effusion.  . Intermittent claudication     NL ABIs, 12/2011  . Myocardial infarction 1962  . DVT (deep venous thrombosis) 1962    LLE  . Heart murmur   . Type II diabetes mellitus   . Anemia   . History of blood transfusion 1971    "got real  sick during pregnancy"  . GERD (gastroesophageal reflux disease)   . Migraine     "a few times/yr" (01/26/2014)  . Arthritis     "knees, back" (01/26/2014)  . Osteoporosis   . Chronic lower back pain   . Anxiety   . Nephrolithiasis     "I have a large stone in my left kidney" (01/26/2014)  . Skin cancer     Assessment: 65 YOF with hx St. Jude MVR and Afib. Dental bleeding: s/p suture with hemostasis.    Patient on chronic warfarin pta but due to a dental complication she is currently being bridge with IV heparin until stable. Heparin level this am is just above upper end of goal will make small rate adjustment. No bleeding noted overnight. Will follow up possible restart of oral anticoagulation.  Goal of Therapy:  Heparin level 0.3-0.5 units/ml (lower goal due to recent oral bleeding) Monitor platelets by anticoagulation  protocol: Yes   Plan:  Reduce IV heparin drip to 850 units/hr Daily heparin level Will continue to monitor for any signs/symptoms of bleeding. Follow up transition back to coumadin  Erin Hearing PharmD., BCPS Clinical Pharmacist Pager [phone] 01/27/2014 5:17 AM

## 2014-01-28 DIAGNOSIS — F3289 Other specified depressive episodes: Secondary | ICD-10-CM

## 2014-01-28 DIAGNOSIS — D500 Iron deficiency anemia secondary to blood loss (chronic): Secondary | ICD-10-CM

## 2014-01-28 LAB — CBC
HCT: 23.7 % — ABNORMAL LOW (ref 36.0–46.0)
Hemoglobin: 7.7 g/dL — ABNORMAL LOW (ref 12.0–15.0)
MCH: 30.1 pg (ref 26.0–34.0)
MCHC: 32.5 g/dL (ref 30.0–36.0)
MCV: 92.6 fL (ref 78.0–100.0)
Platelets: 110 10*3/uL — ABNORMAL LOW (ref 150–400)
RBC: 2.56 MIL/uL — AB (ref 3.87–5.11)
RDW: 15.3 % (ref 11.5–15.5)
WBC: 4.8 10*3/uL (ref 4.0–10.5)

## 2014-01-28 LAB — PROTIME-INR
INR: 1.18 (ref 0.00–1.49)
PROTHROMBIN TIME: 15 s (ref 11.6–15.2)

## 2014-01-28 LAB — HEMOGLOBIN AND HEMATOCRIT, BLOOD
HCT: 22.4 % — ABNORMAL LOW (ref 36.0–46.0)
Hemoglobin: 7.4 g/dL — ABNORMAL LOW (ref 12.0–15.0)

## 2014-01-28 LAB — GLUCOSE, CAPILLARY
GLUCOSE-CAPILLARY: 141 mg/dL — AB (ref 70–99)
GLUCOSE-CAPILLARY: 133 mg/dL — AB (ref 70–99)
GLUCOSE-CAPILLARY: 154 mg/dL — AB (ref 70–99)
Glucose-Capillary: 169 mg/dL — ABNORMAL HIGH (ref 70–99)
Glucose-Capillary: 149 mg/dL — ABNORMAL HIGH (ref 70–99)

## 2014-01-28 LAB — PREPARE RBC (CROSSMATCH)

## 2014-01-28 MED ORDER — SODIUM CHLORIDE 0.9 % IV SOLN
Freq: Once | INTRAVENOUS | Status: AC
  Administered 2014-01-28: 14:00:00 via INTRAVENOUS

## 2014-01-28 MED ORDER — WARFARIN SODIUM 7.5 MG PO TABS
7.5000 mg | ORAL_TABLET | Freq: Once | ORAL | Status: AC
  Administered 2014-01-28: 7.5 mg via ORAL
  Filled 2014-01-28: qty 1

## 2014-01-28 MED ORDER — FUROSEMIDE 10 MG/ML IJ SOLN
20.0000 mg | Freq: Once | INTRAMUSCULAR | Status: AC
  Administered 2014-01-28: 20 mg via INTRAVENOUS
  Filled 2014-01-28: qty 2

## 2014-01-28 MED ORDER — BISACODYL 10 MG RE SUPP
10.0000 mg | Freq: Every day | RECTAL | Status: DC | PRN
  Filled 2014-01-28: qty 1

## 2014-01-28 MED ORDER — FUROSEMIDE 40 MG PO TABS
40.0000 mg | ORAL_TABLET | Freq: Every day | ORAL | Status: DC
  Administered 2014-01-29 – 2014-01-30 (×2): 40 mg via ORAL
  Filled 2014-01-28 (×2): qty 1

## 2014-01-28 MED ORDER — SENNOSIDES-DOCUSATE SODIUM 8.6-50 MG PO TABS
1.0000 | ORAL_TABLET | Freq: Two times a day (BID) | ORAL | Status: DC
  Administered 2014-01-28 – 2014-01-30 (×5): 1 via ORAL
  Filled 2014-01-28 (×5): qty 1

## 2014-01-28 NOTE — Progress Notes (Addendum)
[facility] PROGRESS NOTE  Elizabeth Wallace RXV:[id number] DOB: 07/20/1933 DOA: 01/25/2014 PCP: Glenda Chroman., MD  Assessment/Plan: 80 y/o female with PMH of CAD, s/p MVR (mechanical valve) s/p TVR, CHF, A fib on coumadin, HTN, DM underwent dental extractions on 8/18 presented with post procedure bleeding; Per patient she stopped coumadin 8/14, and started taking lovenox BID  1. Acute blood loss anemia due to dental post procedure bleeding on AC;  -Hg improved s/p Tf 1 units; no new s/s of bleeding; monitor Hg, TF prn; s/p dental suture per dental service;  -Pt was on temporary  IV heparin; change to SQ lovenox+coumadin  -repeat Hg, Tf prn;   2. A fib RVR uncontrolled; likely due to Hypovolemia due to blood loss  -denies chest pain, no SOB trop prelim neg; echo: LVEF 55% Wall motion was normal; there were no regional wall motion abnormalities. -increased BB to 75 Mg 8/20; appreciate cardiology input; decrease lasix, clinically nto fluid overloaded  3. s/p MVR (mechanical valve) on AC (coumadin), s/p TVR  -stopped coumadin 8/14; resume lovenox+coumadin  4. CAD; h/o LHC (negative 2002); denies acute chest pain; cont monitor  5. DM, hold PO meds; ISS 6. CHF, chronic; clinically euvolemic; resume home regimen ;  7. S/p dental procedure; Pt received atx prophylaxis  8. Depression, reports SI, no plans; consulted psychiatry     monitor overnight; possible d/c on 8/22;   D/w patient, confirmed -Pt is DNR Code Status: DNR Family Communication: d/w patient, called updated Elizabeth Wallace,Elizabeth Wallace Daughter [phone] (indicate person spoken with, relationship, and if by phone, the number) Disposition Plan: home pend clinical improvement    Consultants:  DDS  Procedures:  none  Antibiotics:  none (indicate start date, and stop date if known)  HPI/Subjective: alert  Objective: Filed Vitals:   01/28/14 0558  BP: 110/63  Pulse: 92  Temp: 97.7 F (36.5 C)  Resp: 18     Intake/Output Summary (Last 24 hours) at 01/28/14 0811 Last data filed at 01/27/14 1848  Gross per 24 hour  Intake    720 ml  Output    600 ml  Net    120 ml   Filed Weights   01/26/14 0823 01/28/14 0455  Weight: 71.169 kg (156 lb 14.4 oz) 77.202 kg (170 lb 3.2 oz)    Exam:   General:  alert  Cardiovascular: P8,K9 tachy, metallic sound   Respiratory: CTA BL  Abdomen: soft, nt,nd   Musculoskeletal: mild edema   Data Reviewed: Basic Metabolic Panel:  Recent Labs Lab 01/25/14 2229  NA 142  K 4.4  CL 109  CO2 23  GLUCOSE 144*  BUN 24*  CREATININE 1.17*  CALCIUM 8.3*   Liver Function Tests: No results found for this basename: AST, ALT, ALKPHOS, BILITOT, PROT, ALBUMIN,  in the last 168 hours No results found for this basename: LIPASE, AMYLASE,  in the last 168 hours No results found for this basename: AMMONIA,  in the last 168 hours CBC:  Recent Labs Lab 01/25/14 2229 01/26/14 0215 01/26/14 0730 01/26/14 1058 01/26/14 1905 01/27/14 0047 01/27/14 0406 01/28/14 0430  WBC 5.9 6.5 4.9  --   --   --  4.4 4.8  NEUTROABS 5.0  --   --   --   --   --   --   --   HGB 8.0* 8.1* 7.0* 7.1* 8.4* 8.4* 8.5* 7.7*  HCT 24.4* 24.8* 22.0* 21.4* 25.0* 25.9* 25.9* 23.7*  MCV 91.4 91.2 91.7  --   --   --  93.2 92.6  PLT 113* 133* 123*  --   --   --  99* 110*   Cardiac Enzymes:  Recent Labs Lab 01/26/14 0730 01/26/14 1058 01/26/14 1905  TROPONINI <0.30 <0.30 <0.30   BNP (last 3 results) No results found for this basename: PROBNP,  in the last 8760 hours CBG:  Recent Labs Lab 01/27/14 0744 01/27/14 1151 01/27/14 1648 01/27/14 2219 01/28/14 0733  GLUCAP 136* 181* 164* 169* 141*    No results found for this or any previous visit (from the past 240 hour(s)).   Studies: No results found.  Scheduled Meds: . ALPRAZolam  0.5 mg Oral TID  . enoxaparin (LOVENOX) injection  70 mg Subcutaneous Q12H  . furosemide  20 mg Oral Daily  . insulin aspart  0-5 Units  Subcutaneous QHS  . insulin aspart  0-9 Units Subcutaneous TID WC  . loratadine  10 mg Oral Daily  . metoprolol succinate  75 mg Oral Daily  . olopatadine  1 drop Both Eyes BID  . pantoprazole  40 mg Oral Daily  . polyethylene glycol  17 g Oral Daily  . potassium chloride  10 mEq Oral Daily  . senna-docusate  1 tablet Oral BID  . simvastatin  40 mg Oral QHS  . sucralfate  1 g Oral TID  . traMADol-acetaminophen  2 tablet Oral TID  . Warfarin - Pharmacist Dosing Inpatient   Does not apply q1800   Continuous Infusions:    Principal Problem:   Bleeding gums after dental procedure Active Problems:   Blood loss anemia- tranfused   Chronic atrial fibrillation   History tricuspid valve replacement with bioprosthetic valve   History of mitral valve replacement with mechanical valve   Chronic anticoagulation- INR goal 2.5-3.5   Normal coronary arteries May 2013/ Normal LVF echo Aug 2015   Chronic diastolic heart failure    Time spent: >35 minutes     Kinnie Feil  [facility] Pager [phone]. If 7PM-7AM, please contact night-coverage at [email or web address], password [facility] 01/28/2014, 8:11 AM  LOS: 3 days

## 2014-01-28 NOTE — Progress Notes (Signed)
Subjective:  Crying. No CP, no SOB  Objective:  Vital Signs in the last 24 hours: Temp:  [97.6 F (36.4 C)-98.4 F (36.9 C)] 97.7 F (36.5 C) (08/21 0558) Pulse Rate:  [85-108] 92 (08/21 0558) Resp:  [18] 18 (08/21 0558) BP: (102-112)/(52-63) 110/63 mmHg (08/21 0558) SpO2:  [92 %-94 %] 92 % (08/21 0558) Weight:  [170 lb 3.2 oz (77.202 kg)] 170 lb 3.2 oz (77.202 kg) (08/21 0455)  Intake/Output from previous day:  Intake/Output Summary (Last 24 hours) at 01/28/14 0945 Last data filed at 01/28/14 0930  Gross per 24 hour  Intake    480 ml  Output    600 ml  Net   -120 ml    Physical Exam: General appearance: alert, cooperative, no distress, mildly obese and pale Lungs: clear to auscultation bilaterally Heart: irregularly irregular rhythm and positive valve sounds   Rate: 90-120  Rhythm: atrial fibrillation and atrial flutter  Lab Results:  Recent Labs  01/27/14 0406 01/28/14 0430  WBC 4.4 4.8  HGB 8.5* 7.7*  PLT 99* 110*    Recent Labs  01/25/14 2229  NA 142  K 4.4  CL 109  CO2 23  GLUCOSE 144*  BUN 24*  CREATININE 1.17*    Recent Labs  01/26/14 1058 01/26/14 1905  TROPONINI <0.30 <0.30    Recent Labs  01/28/14 0430  INR 1.18     Cardiac Studies: Echo 01/26/14 Study Conclusions  - Left ventricle: The cavity size was normal. Systolic function was normal. The estimated ejection fraction was in the range of 55% to 60%. Wall motion was normal; there were no regional wall motion abnormalities. - Aortic valve: Severe thickening and calcification, consistent with sclerosis. Valve mobility was restricted. There was mild stenosis. There was moderate regurgitation. Valve area (VTI): 1.26 cm^2. Valve area (Vmax): 1.24 cm^2. Valve area (Vmean): 1.35 cm^2. - Mitral valve: A mechanical prosthesis was present. Regurgitation could not be evaluated due to acoustic artifact from the prosthesis. Valve area by continuity equation (using LVOT  flow): 2.3 cm^2. - Right ventricle: The cavity size was moderately dilated. Wall thickness was normal. Systolic function was moderately reduced. - Tricuspid valve: Poorly visualized s/p TV replacement   Assessment/Plan:  80 year old female, followed by Dr Bronson Ing with PMH of chest pain, negative cardiac catheterization in May 2013, history of mitral valve replacement with mechanical valve, and TVR with bioprosthetic valve in 2002. She has a history of permanent atrial fibrillation on Coumadin, hypertension, diabetes, and history of venous insufficiency in the lower extremity presented as a transfer from [facility] 01/25/14 with gum bleeding after dental procedure 8/18. She was noted to be anemic (hgb 7), and to be in a-flutter with RVR. Transfused 1 unit. She had been crossed over pre procedure with Lovenox and had resumed her Lovenox post dental extraction. She was given Vit K at [facility].      Principal Problem:   Bleeding gums after dental procedure Active Problems:   Blood loss anemia- tranfused   Chronic atrial fibrillation   History tricuspid valve replacement with bioprosthetic valve   History of mitral valve replacement with mechanical valve   Chronic anticoagulation- INR goal 2.5-3.5   Normal coronary arteries May 2013/ Normal LVF echo Aug 2015   Chronic diastolic heart failure    PLAN: Telemetry does look like AF and A flutter. B/P is low- limiting medical Rx of her HR. Toprol has been increased to 75 mg daily. Reasonable control. She has been transfused 1  unit and may need further transfusion if Hg remains in the 7 range. Getting Hgb checked at noon again.   Will follow, it may take some time for her INR to come back up after receiving Vit K. She is currently on full dose Lovenox and Coumadin per pharmacy. INR goal 2.5-3.5  Currently being seen by psychiatry for suicidal ideations.   Discussed with hospitalist team.    Candee Furbish, MD

## 2014-01-28 NOTE — Progress Notes (Signed)
ANTICOAGULATION CONSULT NOTE - Initial Consult  Pharmacy Consult for Heparin Indication: St. Jude MVR/Afib  Allergies  Allergen Reactions  . Oxycodone-Acetaminophen     REACTION: gi upset    Patient Measurements: Height: 5\' 2"  (157.5 cm) Weight: 170 lb 3.2 oz (77.202 kg) IBW/kg (Calculated) : 50.1   Vital Signs: Temp: 97.7 F (36.5 C) (08/21 0558) Temp src: Oral (08/21 0558) BP: 110/63 mmHg (08/21 0558) Pulse Rate: 92 (08/21 0558)  Labs:  Recent Labs  01/25/14 2229  01/26/14 0730 01/26/14 1058 01/26/14 1905 01/26/14 1908 01/27/14 0047 01/27/14 0404 01/27/14 0406 01/28/14 0430  HGB 8.0*  < > 7.0* 7.1* 8.4*  --  8.4*  --  8.5* 7.7*  HCT 24.4*  < > 22.0* 21.4* 25.0*  --  25.9*  --  25.9* 23.7*  PLT 113*  < > 123*  --   --   --   --   --  99* 110*  LABPROT  --   --   --  16.0*  --   --   --   --   --  15.0  INR  --   --   --  1.28  --   --   --   --   --  1.18  HEPARINUNFRC  --   --   --   --   --  0.47  --  0.52  --   --   CREATININE 1.17*  --   --   --   --   --   --   --   --   --   TROPONINI  --   --  <0.30 <0.30 <0.30  --   --   --   --   --   < > = values in this interval not displayed.  Estimated Creatinine Clearance: 38.1 ml/min (by C-G formula based on Cr of 1.17).   Assessment: 55 YOF with hx St. Jude MVR and Afib normally anticoagulated with warfarin PTA. The patient began holding warfarin starting on 8/15 for a dental procedure (tooth extraction) planned on 8/18. During this time the patient was bridged with Lovenox 60 mg bid - the patient's last dose PTA was on 8/18 at 0700.   The patient underwent the dental procedure on 8/18 AM with 4 teeth removed and had achieved hemostasis upon leaving the dental office. Later that afternoon, the patient presented to [facility] with oral bleeding. The patient was given Vit K and transferred to [facility]. Oral surgery was consulted and hemostasis was once again achieved.  Pharmacy was consulted to start heparin on  8/19.  Transitioning to Lovenox/warfarin 8/20.  INR 1.18.  Goal of Therapy:  Therapeutic anticoagulation with Lovenox INR 2.5-3.5   Plan:  1. Continue Lovenox 70mg  sq q12 2. Warfarin 7.5mg  po x 1 dose 3. Daily Protimes  Heide Guile, PharmD, [facility] Clinical Pharmacist Pager [phone]   01/28/2014 11:20 AM

## 2014-01-28 NOTE — Progress Notes (Signed)
Note/chart reviewed.  Katie Kahli Fitzgerald, RD, LDN Pager #: [phone] After-Hours Pager #: [phone]  

## 2014-01-28 NOTE — Clinical Social Work Psych Assess (Signed)
Clinical Social Work Department CLINICAL SOCIAL WORK PSYCHIATRY SERVICE LINE ASSESSMENT 01/28/2014  Patient:  Elizabeth Wallace  Account:  [id number]  [place] Date:  01/25/2014  Clinical Social Worker:  Wylene Men  Date/Time:  01/28/2014 12:30 PM Referred by:  Physician  Date referred:  01/28/2014 Reason for Referral  Psychosocial assessment  Other - See comment   Presenting Symptoms/Problems (In the person's/family's own words):   Pt scored 21 on PHQ-9 depression screening and expresses that she feels her family would be better off if she were dead   Abuse/Neglect/Trauma History (check all that apply)  Denies history   Abuse/Neglect/Trauma Comments:   none   Psychiatric History (check all that apply)  Denies history   Psychiatric medications:  ALPRAZolam  Tramadol   Current Mental Health Hospitalizations/Previous Mental Health History:   pt denies   Current provider:   pt denies [facility] provider   Place and Date:   none   Current Medications:   Scheduled Meds:      . ALPRAZolam  0.5 mg Oral TID  . enoxaparin (LOVENOX) injection  70 mg Subcutaneous Q12H  . furosemide  20 mg Oral Daily  . insulin aspart  0-5 Units Subcutaneous QHS  . insulin aspart  0-9 Units Subcutaneous TID WC  . loratadine  10 mg Oral Daily  . metoprolol succinate  75 mg Oral Daily  . olopatadine  1 drop Both Eyes BID  . pantoprazole  40 mg Oral Daily  . polyethylene glycol  17 g Oral Daily  . potassium chloride  10 mEq Oral Daily  . senna-docusate  1 tablet Oral BID  . simvastatin  40 mg Oral QHS  . sucralfate  1 g Oral TID  . traMADol-acetaminophen  2 tablet Oral TID  . warfarin  7.5 mg Oral ONCE-1800  . Warfarin - Pharmacist Dosing Inpatient   Does not apply q1800        Continuous Infusions:      PRN Meds:.bisacodyl       Previous Impatient Admission/Date/Reason:   Pt has not been seen by [facility] in the past 6 months.   Emotional Health / Current Symptoms    Suicide/Self Harm   None reported   Suicide attempt in the past:   none; pt denies   Other harmful behavior:   none pt denies   Psychotic/Dissociative Symptoms  None reported   Other Psychotic/Dissociative Symptoms:   none pt denies none present upon assessment    Attention/Behavioral Symptoms  Within Normal Limits   Other Attention / Behavioral Symptoms:   Pt presented tearful during the assessment    Cognitive Impairment  Orientation - Place  Orientation - Self  Orientation - Situation  Orientation - Time   Other Cognitive Impairment:   none    Mood and Adjustment  DEPRESSION    Stress, Anxiety, Trauma, Any Recent Loss/Stressor  None reported   Anxiety (frequency):   none   Phobia (specify):   none   Compulsive behavior (specify):   none   Obsessive behavior (specify):   none   Other:   Pt reports depression and stress related to her physical illnesses namely: diabetes, neuropathy and the mechanical valves in her heart   Substance Abuse/Use  None   SBIRT completed (please refer for detailed history):  NA  Self-reported substance use:   denies   Urinary Drug Screen Completed:  N Alcohol level:   untested    Environmental/Housing/Living Arrangement  Stable housing   Who is  in the home:   husband   Emergency contact:  Daughter Hassan Rowan [phone]   [place]   Patient's Strengths and Goals (patient's own words):   Pt has supportive family and husband.  Pt has insurance and housing.   Clinical Social Worker's Interpretive Summary:   Psych CSW was consulted for PHQ-9 score of 21 with patient expressing thoughts of her family being better off with her dead.  Psych CSW met with pt at bedside. Pt was alert and oriented x4 during the course of the assesment.  Pt was tearful throughout the conversation. Pt expressed thoughts of worthlessness and hopelessness.  Pt stated that her family would be better off if the pt would  "just die" and began to be tearful.  Psych CSW provided comfort and support.  Pt expresses her health concerns leave her feeling helpless and dependent on everyone else to do simple tasks like bathing and cooking.  Pt reports she has "a lady" who comes in and helps with meals and other assistance for her husband who is also home suffering with diabetic neuropathy in his legs.  Pt states she has 8 children 10 total, two of which has passed.    Pt is willing to speak with the psychiatrist for possible medication management and assistance with depression.  Pt states that she has open communication with her PCP and feels comfortable to follow up with him regarding her depression.    psych CSW placed psych consult to assist.   Disposition:  Outpatient referral made/needed Nonnie Done, [place] [phone]  Clinical Social Work

## 2014-01-29 LAB — TYPE AND SCREEN
ABO/RH(D): B POS
ANTIBODY SCREEN: NEGATIVE
UNIT DIVISION: 0
Unit division: 0

## 2014-01-29 LAB — GLUCOSE, CAPILLARY
GLUCOSE-CAPILLARY: 104 mg/dL — AB (ref 70–99)
GLUCOSE-CAPILLARY: 153 mg/dL — AB (ref 70–99)
Glucose-Capillary: 126 mg/dL — ABNORMAL HIGH (ref 70–99)
Glucose-Capillary: 207 mg/dL — ABNORMAL HIGH (ref 70–99)

## 2014-01-29 LAB — CBC
HEMATOCRIT: 25.2 % — AB (ref 36.0–46.0)
Hemoglobin: 8.3 g/dL — ABNORMAL LOW (ref 12.0–15.0)
MCH: 29.6 pg (ref 26.0–34.0)
MCHC: 32.9 g/dL (ref 30.0–36.0)
MCV: 90 fL (ref 78.0–100.0)
Platelets: 101 10*3/uL — ABNORMAL LOW (ref 150–400)
RBC: 2.8 MIL/uL — AB (ref 3.87–5.11)
RDW: 15.5 % (ref 11.5–15.5)
WBC: 3.7 10*3/uL — AB (ref 4.0–10.5)

## 2014-01-29 LAB — PROTIME-INR
INR: 1.23 (ref 0.00–1.49)
Prothrombin Time: 15.5 seconds — ABNORMAL HIGH (ref 11.6–15.2)

## 2014-01-29 MED ORDER — WARFARIN SODIUM 7.5 MG PO TABS
7.5000 mg | ORAL_TABLET | Freq: Once | ORAL | Status: AC
  Administered 2014-01-29: 7.5 mg via ORAL
  Filled 2014-01-29: qty 1

## 2014-01-29 MED ORDER — ENOXAPARIN SODIUM 120 MG/0.8ML ~~LOC~~ SOLN
115.0000 mg | SUBCUTANEOUS | Status: DC
  Administered 2014-01-29: 115 mg via SUBCUTANEOUS
  Filled 2014-01-29 (×2): qty 0.8

## 2014-01-29 MED ORDER — DIGOXIN 125 MCG PO TABS
0.1250 mg | ORAL_TABLET | Freq: Every day | ORAL | Status: DC
  Administered 2014-01-30: 0.125 mg via ORAL
  Filled 2014-01-29: qty 1

## 2014-01-29 MED ORDER — ASPIRIN 81 MG PO CHEW
81.0000 mg | CHEWABLE_TABLET | Freq: Every day | ORAL | Status: DC
  Administered 2014-01-29 – 2014-01-30 (×2): 81 mg via ORAL
  Filled 2014-01-29 (×2): qty 1

## 2014-01-29 MED ORDER — DIGOXIN 250 MCG PO TABS
0.2500 mg | ORAL_TABLET | Freq: Three times a day (TID) | ORAL | Status: AC
  Administered 2014-01-29 – 2014-01-30 (×3): 0.25 mg via ORAL
  Filled 2014-01-29 (×2): qty 1
  Filled 2014-01-29: qty 2
  Filled 2014-01-29: qty 1
  Filled 2014-01-29: qty 2

## 2014-01-29 NOTE — Progress Notes (Signed)
[facility] PROGRESS NOTE  Elizabeth Wallace UXN:[id number] DOB: [birth year] DOA: 01/25/2014 PCP: Glenda Chroman., MD  Assessment/Plan: 80 y/o female with PMH of CAD, s/p MVR (mechanical valve) s/p TVR, CHF, A fib on coumadin, HTN, DM underwent dental extractions on 8/18 presented with post procedure bleeding; Per patient she stopped coumadin 8/14, and started taking lovenox BID  1. Acute blood loss anemia due to dental post procedure bleeding on AC;  -Hg improved s/p Tf 1 unit on 8/19 1 unit 8/21; no new s/s of bleeding; monitor Hg, TF prn; s/p dental suture per dental service;  -Pt was on temporary  IV heparin; change to SQ lovenox+coumadin  -repeat Hg, Tf prn;   2. A fib RVR uncontrolled; likely due to Hypovolemia due to blood loss  -denies chest pain, no SOB trop prelim neg; echo: LVEF 55% Wall motion was normal; there were no regional wall motion abnormalities. -increased BB to 75 Mg 8/20; added dig, ASA per cards; appreciate cardiology input;  3. s/p MVR (mechanical valve) on AC (coumadin), s/p TVR  -stopped coumadin 8/14; resume lovenox+coumadin  4. CAD; h/o LHC (negative 2002); denies acute chest pain; cont monitor  5. DM, hold PO meds; ISS 6. CHF, chronic; clinically euvolemic; resume home regimen ;  7. S/p dental procedure; Pt received atx prophylaxis  8. Depression, reports SI, no plans; consulteded psychiatry; recommended outpatient follow up     monitor overnight; possible d/c on 8/23;   D/w patient, confirmed -Pt is DNR Code Status: DNR Family Communication: d/w patient, called updated Grubbs,Brenda Daughter [phone] (indicate person spoken with, relationship, and if by phone, the number) Disposition Plan: home pend clinical improvement    Consultants:  DDS  Procedures:  none  Antibiotics:  none (indicate start date, and stop date if known)  HPI/Subjective: alert  Objective: Filed Vitals:   01/29/14 0450  BP: 95/49  Pulse: 78  Temp: 98.2 F  (36.8 C)  Resp: 16    Intake/Output Summary (Last 24 hours) at 01/29/14 1120 Last data filed at 01/29/14 0551  Gross per 24 hour  Intake   1195 ml  Output   1650 ml  Net   -455 ml   Filed Weights   01/28/14 0455 01/28/14 1332 01/29/14 0450  Weight: 77.202 kg (170 lb 3.2 oz) 77.61 kg (171 lb 1.6 oz) 77.1 kg (169 lb 15.6 oz)    Exam:   General:  alert  Cardiovascular: S2,G3 tachy, metallic sound   Respiratory: CTA BL  Abdomen: soft, nt,nd   Musculoskeletal: mild edema   Data Reviewed: Basic Metabolic Panel:  Recent Labs Lab 01/25/14 2229  NA 142  K 4.4  CL 109  CO2 23  GLUCOSE 144*  BUN 24*  CREATININE 1.17*  CALCIUM 8.3*   Liver Function Tests: No results found for this basename: AST, ALT, ALKPHOS, BILITOT, PROT, ALBUMIN,  in the last 168 hours No results found for this basename: LIPASE, AMYLASE,  in the last 168 hours No results found for this basename: AMMONIA,  in the last 168 hours CBC:  Recent Labs Lab 01/25/14 2229 01/26/14 0215 01/26/14 0730  01/27/14 0047 01/27/14 0406 01/28/14 0430 01/28/14 1145 01/29/14 0520  WBC 5.9 6.5 4.9  --   --  4.4 4.8  --  3.7*  NEUTROABS 5.0  --   --   --   --   --   --   --   --   HGB 8.0* 8.1* 7.0*  < > 8.4* 8.5* 7.7* 7.4*  8.3*  HCT 24.4* 24.8* 22.0*  < > 25.9* 25.9* 23.7* 22.4* 25.2*  MCV 91.4 91.2 91.7  --   --  93.2 92.6  --  90.0  PLT 113* 133* 123*  --   --  99* 110*  --  101*  < > = values in this interval not displayed. Cardiac Enzymes:  Recent Labs Lab 01/26/14 0730 01/26/14 1058 01/26/14 1905  TROPONINI <0.30 <0.30 <0.30   BNP (last 3 results) No results found for this basename: PROBNP,  in the last 8760 hours CBG:  Recent Labs Lab 01/28/14 0733 01/28/14 1155 01/28/14 1715 01/28/14 2139 01/29/14 0739  GLUCAP 141* 149* 133* 154* 104*    No results found for this or any previous visit (from the past 240 hour(s)).   Studies: No results found.  Scheduled Meds: . ALPRAZolam  0.5  mg Oral TID  . aspirin  81 mg Oral Daily  . digoxin  0.25 mg Oral Q8H   Followed by  . [START ON 01/30/2014] digoxin  0.125 mg Oral Daily  . enoxaparin (LOVENOX) injection  70 mg Subcutaneous Q12H  . furosemide  40 mg Oral Daily  . insulin aspart  0-5 Units Subcutaneous QHS  . insulin aspart  0-9 Units Subcutaneous TID WC  . loratadine  10 mg Oral Daily  . metoprolol succinate  75 mg Oral Daily  . olopatadine  1 drop Both Eyes BID  . pantoprazole  40 mg Oral Daily  . polyethylene glycol  17 g Oral Daily  . potassium chloride  10 mEq Oral Daily  . senna-docusate  1 tablet Oral BID  . simvastatin  40 mg Oral QHS  . sucralfate  1 g Oral TID  . traMADol-acetaminophen  2 tablet Oral TID  . Warfarin - Pharmacist Dosing Inpatient   Does not apply q1800   Continuous Infusions:    Principal Problem:   Bleeding gums after dental procedure Active Problems:   Blood loss anemia- tranfused   Chronic atrial fibrillation   History tricuspid valve replacement with bioprosthetic valve   History of mitral valve replacement with mechanical valve   Chronic anticoagulation- INR goal 2.5-3.5   Normal coronary arteries May 2013/ Normal LVF echo Aug 2015   Chronic diastolic heart failure    Time spent: >35 minutes     Kinnie Feil  [facility] Pager [phone]. If 7PM-7AM, please contact night-coverage at [email or web address], password [facility] 01/29/2014, 11:20 AM  LOS: 4 days

## 2014-01-29 NOTE — Progress Notes (Signed)
Patient Name: Elizabeth Wallace [facility]      SUBJECTIVE admitted following dental procedure with gingival bleeding Given VIt K at [facility]  Noted to have atrial fib/flutter with a rapid rate She has hx of permanent atrial arrhythmia and Mitral valve replacement-mechanical and TV replacement as well  She was transfused with improvement  She is now being bridged with LMWH and coumadin  Most recent INR 1.23 with goal of 2.5  C/o mouth blood but no blleding  Past Medical History  Diagnosis Date  . Chronic atrial fibrillation     currently normal sinus rhythm  . Chronic anticoagulation   . Irritable bowel syndrome     constipation  . Atypical chest pain     Negative catheterization 2002, low risk Cardiolite study November 2011  . Moderate aortic stenosis by prior echocardiogram      asymptomatic  . Depression   . LV dysfunction     echocardiogram March 2013 by her primary care physician ejection fraction 40-45%, moderate aortic stenosis, prosthetic valves poorly reported , small pericardial effusion.  . Intermittent claudication     NL ABIs, 12/2011  . Myocardial infarction 1962  . DVT (deep venous thrombosis) 1962    LLE  . Heart murmur   . Type II diabetes mellitus   . Anemia   . History of blood transfusion 1971    "got real sick during pregnancy"  . GERD (gastroesophageal reflux disease)   . Migraine     "a few times/yr" (01/26/2014)  . Arthritis     "knees, back" (01/26/2014)  . Osteoporosis   . Chronic lower back pain   . Anxiety   . Nephrolithiasis     "I have a large stone in my left kidney" (01/26/2014)  . Skin cancer     Scheduled Meds:  Scheduled Meds: . ALPRAZolam  0.5 mg Oral TID  . enoxaparin (LOVENOX) injection  70 mg Subcutaneous Q12H  . furosemide  40 mg Oral Daily  . insulin aspart  0-5 Units Subcutaneous QHS  . insulin aspart  0-9 Units Subcutaneous TID WC  . loratadine  10 mg Oral Daily  . metoprolol succinate  75 mg Oral Daily  .  olopatadine  1 drop Both Eyes BID  . pantoprazole  40 mg Oral Daily  . polyethylene glycol  17 g Oral Daily  . potassium chloride  10 mEq Oral Daily  . senna-docusate  1 tablet Oral BID  . simvastatin  40 mg Oral QHS  . sucralfate  1 g Oral TID  . traMADol-acetaminophen  2 tablet Oral TID  . Warfarin - Pharmacist Dosing Inpatient   Does not apply q1800   Continuous Infusions:  bisacodyl    PHYSICAL EXAM Filed Vitals:   01/28/14 1721 01/28/14 1752 01/28/14 2100 01/29/14 0450  BP: 116/51 107/49 103/53 95/49  Pulse: 85 76 65 78  Temp: 99.1 F (37.3 C) 99.4 F (37.4 C) 98.4 F (36.9 C) 98.2 F (36.8 C)  TempSrc: Oral Oral Oral Oral  Resp: 18 20 18 16   Height:      Weight:    169 lb 15.6 oz (77.1 kg)  SpO2: 91% 92% 93% 91%    Well developed and nourished in no acute distress HENT normal Neck supple   IrRegular rate and rhythm, mechanical s1 (x2)  Abd-soft with active BS No Clubbing cyanosis edema Skin-warm and dry A & Oriented  Grossly normal sensory and motor function   TELEMETRY: Reviewed  telemetry pt in  Atrial arrhythmia   Intake/Output Summary (Last 24 hours) at 01/29/14 0957 Last data filed at 01/29/14 0551  Gross per 24 hour  Intake   1195 ml  Output   2250 ml  Net  -1055 ml    LABS: Basic Metabolic Panel:  Recent Labs Lab 01/25/14 2229  NA 142  K 4.4  CL 109  CO2 23  GLUCOSE 144*  BUN 24*  CREATININE 1.17*  CALCIUM 8.3*   Cardiac Enzymes:  Recent Labs  01/26/14 1058 01/26/14 1905  TROPONINI <0.30 <0.30   CBC:  Recent Labs Lab 01/25/14 2229 01/26/14 0215 01/26/14 0730 01/26/14 1058 01/26/14 1905 01/27/14 0047 01/27/14 0406 01/28/14 0430 01/28/14 1145 01/29/14 0520  WBC 5.9 6.5 4.9  --   --   --  4.4 4.8  --  3.7*  NEUTROABS 5.0  --   --   --   --   --   --   --   --   --   HGB 8.0* 8.1* 7.0* 7.1* 8.4* 8.4* 8.5* 7.7* 7.4* 8.3*  HCT 24.4* 24.8* 22.0* 21.4* 25.0* 25.9* 25.9* 23.7* 22.4* 25.2*  MCV 91.4 91.2 91.7  --   --    --  93.2 92.6  --  90.0  PLT 113* 133* 123*  --   --   --  99* 110*  --  101*   PROTIME:  Recent Labs  01/26/14 1058 01/28/14 0430 01/29/14 0520  LABPROT 16.0* 15.0 15.5*  INR 1.28 1.18 1.23     ASSESSMENT AND PLAN:  Principal Problem:   Bleeding gums after dental procedure Active Problems:   Blood loss anemia- tranfused   Chronic atrial fibrillation   History tricuspid valve replacement with bioprosthetic valve   History of mitral valve replacement with mechanical valve   Chronic anticoagulation- INR goal 2.5-3.5   Normal coronary arteries May 2013/ Normal LVF echo Aug 2015   Chronic diastolic heart failure  Guidelines would have pt on ASA adjunctively  Do not see explanation for why it was stopped in 2013 Would add dig for augmented rate control given low blood pressure and notwithstanding normal EF    Signed, Virl Axe MD  01/29/2014

## 2014-01-29 NOTE — Progress Notes (Signed)
ANTICOAGULATION CONSULT NOTE - Follow-up  Pharmacy Consult for lovenox + warfarin Indication: St. Jude MVR/Afib  Allergies  Allergen Reactions  . Oxycodone-Acetaminophen     REACTION: gi upset    Patient Measurements: Height: 5\' 2"  (157.5 cm) Weight: 169 lb 15.6 oz (77.1 kg) IBW/kg (Calculated) : 50.1   Vital Signs: Temp: 98.2 F (36.8 C) (08/22 0450) Temp src: Oral (08/22 0450) BP: 95/49 mmHg (08/22 0450) Pulse Rate: 78 (08/22 0450)  Labs:  Recent Labs  01/26/14 1905 01/26/14 1908  01/27/14 0404 01/27/14 0406 01/28/14 0430 01/28/14 1145 01/29/14 0520  HGB 8.4*  --   < >  --  8.5* 7.7* 7.4* 8.3*  HCT 25.0*  --   < >  --  25.9* 23.7* 22.4* 25.2*  PLT  --   --   --   --  99* 110*  --  101*  LABPROT  --   --   --   --   --  15.0  --  15.5*  INR  --   --   --   --   --  1.18  --  1.23  HEPARINUNFRC  --  0.47  --  0.52  --   --   --   --   TROPONINI <0.30  --   --   --   --   --   --   --   < > = values in this interval not displayed.  Estimated Creatinine Clearance: 38.1 ml/min (by C-G formula based on Cr of 1.17).  Assessment: 13 YOF with hx St. Jude MVR and Afib normally anticoagulated with warfarin PTA. The patient began holding warfarin starting on 8/15 for a dental procedure (tooth extraction) planned on 8/18. During this time the patient was bridged with Lovenox 60 mg bid - the patient's last dose PTA was on 8/18 at 0700.   The patient underwent the dental procedure on 8/18 AM with 4 teeth removed and had achieved hemostasis upon leaving the dental office. Later that afternoon, the patient presented to [facility] with oral bleeding. The patient was given Vit K and transferred to [facility]. Oral surgery was consulted and hemostasis was once again achieved.  Warfarin with lovenox have been restarted and INR remains subtherapeutic.   Goal of Therapy:  Therapeutic anticoagulation with Lovenox INR 2.5-3.5   Plan:  1. Change lovenox to 115mg  SQ Q24H  (1.5mg /kg/day for ease of outpatient dosing) 2. Repeat coumadin 7.5mg  PO x 1 tonight 3. F/u AM INR  Salome Arnt, PharmD, BCPS Pager # [phone] 01/29/2014 1:22 PM

## 2014-01-30 LAB — CBC
HCT: 27.4 % — ABNORMAL LOW (ref 36.0–46.0)
Hemoglobin: 9.2 g/dL — ABNORMAL LOW (ref 12.0–15.0)
MCH: 30.5 pg (ref 26.0–34.0)
MCHC: 33.6 g/dL (ref 30.0–36.0)
MCV: 90.7 fL (ref 78.0–100.0)
PLATELETS: 103 10*3/uL — AB (ref 150–400)
RBC: 3.02 MIL/uL — ABNORMAL LOW (ref 3.87–5.11)
RDW: 16 % — AB (ref 11.5–15.5)
WBC: 3.3 10*3/uL — ABNORMAL LOW (ref 4.0–10.5)

## 2014-01-30 LAB — GLUCOSE, CAPILLARY
GLUCOSE-CAPILLARY: 128 mg/dL — AB (ref 70–99)
Glucose-Capillary: 98 mg/dL (ref 70–99)

## 2014-01-30 LAB — PROTIME-INR
INR: 1.5 — ABNORMAL HIGH (ref 0.00–1.49)
PROTHROMBIN TIME: 18.1 s — AB (ref 11.6–15.2)

## 2014-01-30 MED ORDER — WARFARIN SODIUM 7.5 MG PO TABS
7.5000 mg | ORAL_TABLET | Freq: Once | ORAL | Status: DC
  Filled 2014-01-30: qty 1

## 2014-01-30 MED ORDER — DIGOXIN 125 MCG PO TABS: 0.1250 mg | ORAL_TABLET | Freq: Every day | ORAL | Status: DC

## 2014-01-30 MED ORDER — ENOXAPARIN SODIUM 120 MG/0.8ML ~~LOC~~ SOLN: 115.0000 mg | SUBCUTANEOUS | Status: DC

## 2014-01-30 MED ORDER — METOPROLOL SUCCINATE ER 25 MG PO TB24: 75.0000 mg | ORAL_TABLET | Freq: Every day | ORAL | Status: DC

## 2014-01-30 MED ORDER — ASPIRIN 81 MG PO CHEW: 81.0000 mg | CHEWABLE_TABLET | Freq: Every day | ORAL | Status: DC

## 2014-01-30 NOTE — Discharge Instructions (Signed)
Please follow up with primary care doctor in 1-2 days to check your INR

## 2014-01-30 NOTE — Progress Notes (Signed)
ANTICOAGULATION CONSULT NOTE - Follow-up  Pharmacy Consult for lovenox + warfarin Indication: St. Jude MVR/Afib  Allergies  Allergen Reactions  . Oxycodone-Acetaminophen     REACTION: gi upset    Patient Measurements: Height: 5\' 2"  (157.5 cm) Weight: 163 lb 9.6 oz (74.208 kg) IBW/kg (Calculated) : 50.1   Vital Signs: Temp: 97.6 F (36.4 C) (08/23 0534) Temp src: Oral (08/23 0534) BP: 109/65 mmHg (08/23 0534) Pulse Rate: 68 (08/23 0534)  Labs:  Recent Labs  01/28/14 0430 01/28/14 1145 01/29/14 0520 01/30/14 0450  HGB 7.7* 7.4* 8.3* 9.2*  HCT 23.7* 22.4* 25.2* 27.4*  PLT 110*  --  101* 103*  LABPROT 15.0  --  15.5* 18.1*  INR 1.18  --  1.23 1.50*    Estimated Creatinine Clearance: 37.3 ml/min (by C-G formula based on Cr of 1.17).  Assessment: 77 YOF with hx St. Jude MVR and Afib normally anticoagulated with warfarin PTA. The patient began holding warfarin starting on 8/15 for a dental procedure (tooth extraction) planned on 8/18. During this time the patient was bridged with Lovenox 60 mg bid - the patient's last dose PTA was on 8/18 at 0700.   The patient underwent the dental procedure on 8/18 AM with 4 teeth removed and had achieved hemostasis upon leaving the dental office. Later that afternoon, the patient presented to [facility] with oral bleeding. The patient was given Vit K and transferred to [facility]. Oral surgery was consulted and hemostasis was once again achieved.  Pt continues on coumadin and wafarin. INR remains subtherapeutic but is now starting to trend up. No bleeding noted. Pt remains thrombocytopenic.   Goal of Therapy:  Therapeutic anticoagulation with Lovenox INR 2.5-3.5   Plan:  1. Continue lovenox to 115mg  SQ Q24H (1.5mg /kg/day for ease of outpatient dosing) 2. Repeat coumadin 7.5mg  PO x 1 tonight 3. F/u AM INR  Salome Arnt, PharmD, BCPS Pager # [phone] 01/30/2014 10:59 AM

## 2014-01-30 NOTE — Discharge Summary (Signed)
Physician Discharge Summary  Elizabeth Wallace [facility] CWC:[id number] DOB: June 14, 1933 DOA: 01/25/2014  PCP: Glenda Chroman., MD  Admit date: 01/25/2014 Discharge date: 01/30/2014  Time spent: >35 minutes  Recommendations for Outpatient Follow-up:  [facility] F/u with PCP in 1-2 days to check INR  Discharge Diagnoses:  Principal Problem:   Bleeding gums after dental procedure Active Problems:   Blood loss anemia- tranfused   Chronic atrial fibrillation   History tricuspid valve replacement with bioprosthetic valve   History of mitral valve replacement with mechanical valve   Chronic anticoagulation- INR goal 2.5-3.5   Normal coronary arteries May 2013/ Normal LVF echo Aug 2015   Chronic diastolic heart failure   Discharge Condition: stable   Diet recommendation: lwo sodium, DM  Filed Weights   01/28/14 1332 01/29/14 0450 01/30/14 0534  Weight: 77.61 kg (171 lb 1.6 oz) 77.1 kg (169 lb 15.6 oz) 74.208 kg (163 lb 9.6 oz)    History of present illness:  80 y/o female with PMH of CAD, s/p MVR (mechanical valve) s/p TVR, CHF, A fib on coumadin, HTN, DM underwent dental extractions on 8/18 presented with post procedure bleeding; Per patient she stopped coumadin 8/14, and started taking lovenox BID   Hospital Course:  1. Acute blood loss anemia due to dental procedure -->bleeding on AC;  -Hg improved s/p Tf 1 unit on 8/19, and 1 unit on 8/21; no new s/s of bleeding; s/p dental suture per dental service;  -Repeat Hg is stable on AC (lovenox+coumadin); no s/s of bleeding  2. A fib RVR uncontrolled; likely due to Hypovolemia due to blood loss  -denies chest pain, no SOB trop prelim neg; echo: LVEF 55% Wall motion was normal; there were no regional wall motion abnormalities.  - HR improved on increased BB to 75 Mg + added dig, ASA per cards; appreciate cardiology input; cont outpatient follow up  3. s/p MVR (mechanical valve) on AC (coumadin), s/p TVR  -Pt was taking lovenox holding coumadin prior to dental  procedure;  -continued lovenox and resumed coumadin--> INR improving (1.1-->1.5); cont lovenox+coumadin with the goal INR 7.6-1.6 with metallic valve , next INR is on Monday; d/w patient;  4. CAD; h/o LHC (negative 2002); denies acute chest pain; cont monitor; ASA added per cardiology  5. DM, cont home regimen  6. CHF, chronic; clinically euvolemic; resume home regimen ;  7. S/p dental procedure; Pt received atx prophylaxis  8. Depression, reports SI, no plans; consulted psychiatry; recommended outpatient follow up    Procedures:  echo (i.e. Studies not automatically included, echos, thoracentesis, etc; not x-rays)  Consultations:  Cardiology  Dental   Discharge Exam: Filed Vitals:   01/30/14 0534  BP: 109/65  Pulse: 68  Temp: 97.6 F (36.4 C)  Resp: 20    General: alert Cardiovascular: s1,s2 rrr Respiratory: CTA BL  Discharge Instructions  Discharge Instructions   Diet - low sodium heart healthy    Complete by:  As directed      Discharge instructions    Complete by:  As directed   Please follow up with primary care doctor in 1-2 days to check your INR     Increase activity slowly    Complete by:  As directed             Medication List    STOP taking these medications       enoxaparin 60 MG/0.6ML injection  Commonly known as:  LOVENOX  Replaced by:  enoxaparin 120 MG/0.8ML injection  TAKE these medications       ALPRAZolam 0.5 MG tablet  Commonly known as:  XANAX  Take 0.5 mg by mouth 3 (three) times daily.     aspirin 81 MG chewable tablet  Chew 1 tablet (81 mg total) by mouth daily.     DEXILANT 60 MG capsule  Generic drug:  dexlansoprazole  Take 60 mg by mouth daily.     digoxin 0.125 MG tablet  Commonly known as:  LANOXIN  Take 1 tablet (0.125 mg total) by mouth daily.     enoxaparin 120 MG/0.8ML injection  Commonly known as:  LOVENOX  Inject 0.77 mLs (115 mg total) into the skin daily.     fexofenadine 180 MG tablet  Commonly  known as:  ALLEGRA  Take 180 mg by mouth daily.     furosemide 40 MG tablet  Commonly known as:  LASIX  Take 40 mg by mouth daily.     glipiZIDE 2.5 MG 24 hr tablet  Commonly known as:  GLUCOTROL XL  Take 2.5 mg by mouth daily.     levocetirizine 5 MG tablet  Commonly known as:  XYZAL  Take 1 tablet by mouth daily.     metoprolol succinate 25 MG 24 hr tablet  Commonly known as:  TOPROL-XL  Take 3 tablets (75 mg total) by mouth daily. Take with or immediately following a meal.     PATANOL 0.1 % ophthalmic solution  Generic drug:  olopatadine  Place 1 drop into both eyes 2 (two) times daily.     polyethylene glycol packet  Commonly known as:  MIRALAX / GLYCOLAX  Take 17 g by mouth daily.     potassium chloride 10 MEQ CR tablet  Commonly known as:  KLOR-CON  Take 10 mEq by mouth daily.     raloxifene 60 MG tablet  Commonly known as:  EVISTA  Take 60 mg by mouth daily.     simvastatin 40 MG tablet  Commonly known as:  ZOCOR  Take 40 mg by mouth at bedtime.     sucralfate 1 GM/10ML suspension  Commonly known as:  CARAFATE  Take 1 g by mouth 3 (three) times daily.     traMADol-acetaminophen 37.5-325 MG per tablet  Commonly known as:  ULTRACET  Take 2 tablets by mouth 3 (three) times daily.     triamcinolone cream 0.1 %  Commonly known as:  KENALOG  Apply 1 application topically 2 (two) times daily.     VOLTAREN 1 % Gel  Generic drug:  diclofenac sodium  Apply 2 g topically 4 (four) times daily.     warfarin 5 MG tablet  Commonly known as:  COUMADIN  Take 2.5-5 mg by mouth See admin instructions. 1 tablet (5 mg) daily EXCEPT for 1/2 tablet (2.5 mg) on T/Th/Sun       Allergies  Allergen Reactions  . Oxycodone-Acetaminophen     REACTION: gi upset       Follow-up Information   Follow up with VYAS,DHRUV B., MD. Schedule an appointment as soon as possible for a visit in 1 day.   Specialty:  Internal Medicine   Contact information:   [address]  [phone]        The results of significant diagnostics from this hospitalization (including imaging, microbiology, ancillary and laboratory) are listed below for reference.    Significant Diagnostic Studies: No results found.  Microbiology: No results found for this or any previous visit (from  the past 240 hour(s)).   Labs: Basic Metabolic Panel:  Recent Labs Lab 01/25/14 2229  NA 142  K 4.4  CL 109  CO2 23  GLUCOSE 144*  BUN 24*  CREATININE 1.17*  CALCIUM 8.3*   Liver Function Tests: No results found for this basename: AST, ALT, ALKPHOS, BILITOT, PROT, ALBUMIN,  in the last 168 hours No results found for this basename: LIPASE, AMYLASE,  in the last 168 hours No results found for this basename: AMMONIA,  in the last 168 hours CBC:  Recent Labs Lab 01/25/14 2229  01/26/14 0730  01/27/14 0406 01/28/14 0430 01/28/14 1145 01/29/14 0520 01/30/14 0450  WBC 5.9  < > 4.9  --  4.4 4.8  --  3.7* 3.3*  NEUTROABS 5.0  --   --   --   --   --   --   --   --   HGB 8.0*  < > 7.0*  < > 8.5* 7.7* 7.4* 8.3* 9.2*  HCT 24.4*  < > 22.0*  < > 25.9* 23.7* 22.4* 25.2* 27.4*  MCV 91.4  < > 91.7  --  93.2 92.6  --  90.0 90.7  PLT 113*  < > 123*  --  99* 110*  --  101* 103*  < > = values in this interval not displayed. Cardiac Enzymes:  Recent Labs Lab 01/26/14 0730 01/26/14 1058 01/26/14 1905  TROPONINI <0.30 <0.30 <0.30   BNP: BNP (last 3 results) No results found for this basename: PROBNP,  in the last 8760 hours CBG:  Recent Labs Lab 01/29/14 0739 01/29/14 1255 01/29/14 1705 01/29/14 2120 01/30/14 0800  GLUCAP 104* 153* 126* 207* 98       Signed:  Syriana Croslin N  [facility] 01/30/2014, 11:01 AM

## 2014-01-30 NOTE — Progress Notes (Signed)
IVs removed per order. Discharge instructions and prescriptions given to patient/son with [employer]. Discharged via wheelchair to son's care with NT present. Melford Aase, RN

## 2014-01-31 ENCOUNTER — Emergency Department (HOSPITAL_COMMUNITY)
Admission: EM | Admit: 2014-01-31 | Discharge: 2014-02-01 | Disposition: A | Discharge status: Home / Self Care | Payer: Medicare Other | Attending: Emergency Medicine | Admitting: Emergency Medicine

## 2014-01-31 DIAGNOSIS — IMO0002 Reserved for concepts with insufficient information to code with codable children: Secondary | ICD-10-CM | POA: Diagnosis not present

## 2014-01-31 DIAGNOSIS — F329 Major depressive disorder, single episode, unspecified: Secondary | ICD-10-CM | POA: Insufficient documentation

## 2014-01-31 DIAGNOSIS — E119 Type 2 diabetes mellitus without complications: Secondary | ICD-10-CM | POA: Diagnosis not present

## 2014-01-31 DIAGNOSIS — R011 Cardiac murmur, unspecified: Secondary | ICD-10-CM | POA: Insufficient documentation

## 2014-01-31 DIAGNOSIS — G43909 Migraine, unspecified, not intractable, without status migrainosus: Secondary | ICD-10-CM | POA: Insufficient documentation

## 2014-01-31 DIAGNOSIS — I4891 Unspecified atrial fibrillation: Secondary | ICD-10-CM | POA: Diagnosis not present

## 2014-01-31 DIAGNOSIS — G8929 Other chronic pain: Secondary | ICD-10-CM | POA: Insufficient documentation

## 2014-01-31 DIAGNOSIS — I252 Old myocardial infarction: Secondary | ICD-10-CM | POA: Insufficient documentation

## 2014-01-31 DIAGNOSIS — F3289 Other specified depressive episodes: Secondary | ICD-10-CM | POA: Insufficient documentation

## 2014-01-31 DIAGNOSIS — K068 Other specified disorders of gingiva and edentulous alveolar ridge: Secondary | ICD-10-CM

## 2014-01-31 DIAGNOSIS — Z86718 Personal history of other venous thrombosis and embolism: Secondary | ICD-10-CM | POA: Insufficient documentation

## 2014-01-31 DIAGNOSIS — Z85828 Personal history of other malignant neoplasm of skin: Secondary | ICD-10-CM | POA: Diagnosis not present

## 2014-01-31 DIAGNOSIS — K219 Gastro-esophageal reflux disease without esophagitis: Secondary | ICD-10-CM | POA: Insufficient documentation

## 2014-01-31 DIAGNOSIS — D689 Coagulation defect, unspecified: Secondary | ICD-10-CM | POA: Insufficient documentation

## 2014-01-31 DIAGNOSIS — Z7901 Long term (current) use of anticoagulants: Secondary | ICD-10-CM | POA: Diagnosis not present

## 2014-01-31 DIAGNOSIS — M1710 Unilateral primary osteoarthritis, unspecified knee: Secondary | ICD-10-CM | POA: Insufficient documentation

## 2014-01-31 DIAGNOSIS — Y838 Other surgical procedures as the cause of abnormal reaction of the patient, or of later complication, without mention of misadventure at the time of the procedure: Secondary | ICD-10-CM | POA: Insufficient documentation

## 2014-01-31 DIAGNOSIS — Z87442 Personal history of urinary calculi: Secondary | ICD-10-CM | POA: Diagnosis not present

## 2014-01-31 DIAGNOSIS — Z862 Personal history of diseases of the blood and blood-forming organs and certain disorders involving the immune mechanism: Secondary | ICD-10-CM | POA: Insufficient documentation

## 2014-01-31 DIAGNOSIS — Z79899 Other long term (current) drug therapy: Secondary | ICD-10-CM | POA: Insufficient documentation

## 2014-01-31 DIAGNOSIS — Z7982 Long term (current) use of aspirin: Secondary | ICD-10-CM | POA: Insufficient documentation

## 2014-01-31 DIAGNOSIS — F411 Generalized anxiety disorder: Secondary | ICD-10-CM | POA: Insufficient documentation

## 2014-02-01 ENCOUNTER — Encounter (HOSPITAL_COMMUNITY): Payer: Self-pay | Admitting: Emergency Medicine

## 2014-02-01 DIAGNOSIS — IMO0002 Reserved for concepts with insufficient information to code with codable children: Secondary | ICD-10-CM | POA: Diagnosis not present

## 2014-02-01 LAB — CBC WITH DIFFERENTIAL/PLATELET
BASOS ABS: 0 10*3/uL (ref 0.0–0.1)
BASOS PCT: 0 % (ref 0–1)
EOS ABS: 0.1 10*3/uL (ref 0.0–0.7)
EOS PCT: 2 % (ref 0–5)
HCT: 31.1 % — ABNORMAL LOW (ref 36.0–46.0)
Hemoglobin: 10 g/dL — ABNORMAL LOW (ref 12.0–15.0)
Lymphocytes Relative: 16 % (ref 12–46)
Lymphs Abs: 0.7 10*3/uL (ref 0.7–4.0)
MCH: 29.6 pg (ref 26.0–34.0)
MCHC: 32.2 g/dL (ref 30.0–36.0)
MCV: 92 fL (ref 78.0–100.0)
Monocytes Absolute: 0.3 10*3/uL (ref 0.1–1.0)
Monocytes Relative: 6 % (ref 3–12)
NEUTROS PCT: 76 % (ref 43–77)
Neutro Abs: 3.4 10*3/uL (ref 1.7–7.7)
Platelets: 127 10*3/uL — ABNORMAL LOW (ref 150–400)
RBC: 3.38 MIL/uL — AB (ref 3.87–5.11)
RDW: 15.4 % (ref 11.5–15.5)
WBC: 4.5 10*3/uL (ref 4.0–10.5)

## 2014-02-01 LAB — BASIC METABOLIC PANEL
Anion gap: 12 (ref 5–15)
BUN: 16 mg/dL (ref 6–23)
CO2: 28 mEq/L (ref 19–32)
Calcium: 10.2 mg/dL (ref 8.4–10.5)
Chloride: 103 mEq/L (ref 96–112)
Creatinine, Ser: 1.26 mg/dL — ABNORMAL HIGH (ref 0.50–1.10)
GFR, EST AFRICAN AMERICAN: 46 mL/min — AB (ref >90)
GFR, EST NON AFRICAN AMERICAN: 40 mL/min — AB (ref >90)
GLUCOSE: 117 mg/dL — AB (ref 70–99)
POTASSIUM: 4.2 meq/L (ref 3.7–5.3)
SODIUM: 143 meq/L (ref 137–147)

## 2014-02-01 LAB — TROPONIN I: Troponin I: <0.3 ng/mL (ref <0.30)

## 2014-02-01 LAB — TYPE AND SCREEN
ABO/RH(D): B POS
Antibody Screen: NEGATIVE

## 2014-02-01 LAB — PROTIME-INR
INR: 1.35 (ref 0.00–1.49)
PROTHROMBIN TIME: 16.7 s — AB (ref 11.6–15.2)

## 2014-02-01 LAB — DIGOXIN LEVEL: Digoxin Level: 1.2 ng/mL (ref 0.8–2.0)

## 2014-02-01 MED ORDER — TRAMADOL-ACETAMINOPHEN 37.5-325 MG PO TABS
1.0000 | ORAL_TABLET | Freq: Once | ORAL | Status: AC
  Administered 2014-02-01: 1 via ORAL
  Filled 2014-02-01: qty 1

## 2014-02-01 MED ORDER — "THROMBI-PAD 3""X3"" EX PADS"
1.0000 | MEDICATED_PAD | Freq: Once | CUTANEOUS | Status: AC
  Administered 2014-02-01: 1 via TOPICAL
  Filled 2014-02-01: qty 1

## 2014-02-01 MED ORDER — AMINOCAPROIC ACID SOLUTION 5% (50 MG/ML)
5.0000 mL | Freq: Once | ORAL | Status: AC
  Administered 2014-02-01: 5 mL via ORAL
  Filled 2014-02-01: qty 100

## 2014-02-01 MED ORDER — ONDANSETRON HCL 4 MG/2ML IJ SOLN
4.0000 mg | Freq: Once | INTRAMUSCULAR | Status: AC
  Administered 2014-02-01: 4 mg via INTRAVENOUS

## 2014-02-01 NOTE — ED Notes (Signed)
Dr. Sharol Given into room at [facility], blood drawn and sent.

## 2014-02-01 NOTE — ED Notes (Signed)
Pt given tea bag to clamp down on with gums/socket/area of bleeding

## 2014-02-01 NOTE — ED Notes (Signed)
teabag removed. Amicar gently swished. Thrombi pad placed.

## 2014-02-01 NOTE — ED Notes (Signed)
Dr. Sharol Given at [facility]. Bleeding assessed after tea bag placement. Bleeding continues in 1 of the 3 bleeding sites (R lower canine area), thrombi pad ordered. Pt tolerating well. Spitting and wiping. Mouth gently rinsed.

## 2014-02-01 NOTE — ED Notes (Signed)
Attempted call home to son. No response, message left.

## 2014-02-01 NOTE — ED Provider Notes (Addendum)
CSN: [id number]     Arrival date & time 01/31/14  2331 History   First MD Initiated Contact with Patient 01/31/14 2351     Chief Complaint  Patient presents with  . Coagulation Disorder  . Post-op Problem     (Consider location/radiation/quality/duration/timing/severity/associated sxs/prior Treatment) HPI 80 year old female presents emergency apartment the EMS to 2 persistent tooth bleeding.  Patient was admitted over the weekend secondary to bleeding from dental extractions.  Patient is on blood thinners for St. Jude's valve, chronic atrial fibrillation.  Patient received vitamin K and blood transfusion.  She had four sutures with surgicel placed by OMFS.  Patient reports after going home on Sunday she developed bleeding.  It continued through the night and throughout the day today.  She did not contact her dentist.  Patient is unsure which teeth are bleeding. Past Medical History  Diagnosis Date  . Chronic atrial fibrillation     currently normal sinus rhythm  . Chronic anticoagulation   . Irritable bowel syndrome     constipation  . Atypical chest pain     Negative catheterization 2002, low risk Cardiolite study November 2011  . Moderate aortic stenosis by prior echocardiogram      asymptomatic  . Depression   . LV dysfunction     echocardiogram March 2013 by her primary care physician ejection fraction 40-45%, moderate aortic stenosis, prosthetic valves poorly reported , small pericardial effusion.  . Intermittent claudication     NL ABIs, 12/2011  . Myocardial infarction 1962  . DVT (deep venous thrombosis) 1962    LLE  . Heart murmur   . Type II diabetes mellitus   . Anemia   . History of blood transfusion 1971    "got real sick during pregnancy"  . GERD (gastroesophageal reflux disease)   . Migraine     "a few times/yr" (01/26/2014)  . Arthritis     "knees, back" (01/26/2014)  . Osteoporosis   . Chronic lower back pain   . Anxiety   . Nephrolithiasis     "I have a  large stone in my left kidney" (01/26/2014)  . Skin cancer    Past Surgical History  Procedure Laterality Date  . Multiple tooth extractions  01/25/2014    "had 4 pulled"  . Cholecystectomy    . Appendectomy    . Hernia repair    . Umbilical hernia repair    . Tricuspid valve replacement  10/2000    bioprosthetic valve  . Patent foramen ovale closure  1971  . Mitral valve replacement  10/2000     St. Jude's bileaflet prosthesis  . Cardiac catheterization  ~ 3 times  . Skin cancer excision      "off my nose"  . Abdominal hysterectomy  1970's   Family History  Problem Relation Age of Onset  . Diabetes Mother   . CAD Mother   . Heart failure Father   . Stroke Sister 81   History  Substance Use Topics  . Smoking status: Never Smoker   . Smokeless tobacco: Never Used  . Alcohol Use: No   OB History   Grav Para Term Preterm Abortions TAB SAB Ect Mult Living                 Review of Systems   See History of Present Illness; otherwise all other systems are reviewed and negative  Allergies  Oxycodone-acetaminophen  Home Medications   Prior to Admission medications   Medication Sig Start Date  End Date Taking? Authorizing Provider  ALPRAZolam Duanne Moron) 0.5 MG tablet Take 0.5 mg by mouth 3 (three) times daily.     Historical Provider, MD  aspirin 81 MG chewable tablet Chew 1 tablet (81 mg total) by mouth daily. 01/30/14   Kinnie Feil, MD  dexlansoprazole (DEXILANT) 60 MG capsule Take 60 mg by mouth daily.    Historical Provider, MD  digoxin (LANOXIN) 0.125 MG tablet Take 1 tablet (0.125 mg total) by mouth daily. 01/30/14   Kinnie Feil, MD  enoxaparin (LOVENOX) 120 MG/0.8ML injection Inject 0.77 mLs (115 mg total) into the skin daily. 01/30/14   Kinnie Feil, MD  fexofenadine (ALLEGRA) 180 MG tablet Take 180 mg by mouth daily.    Historical Provider, MD  furosemide (LASIX) 40 MG tablet Take 40 mg by mouth daily. 07/07/13   Herminio Commons, MD  glipiZIDE (GLUCOTROL  XL) 2.5 MG 24 hr tablet Take 2.5 mg by mouth daily.    Historical Provider, MD  levocetirizine (XYZAL) 5 MG tablet Take 1 tablet by mouth daily. 04/02/13   Historical Provider, MD  metoprolol succinate (TOPROL-XL) 25 MG 24 hr tablet Take 3 tablets (75 mg total) by mouth daily. Take with or immediately following a meal. 01/30/14   Kinnie Feil, MD  olopatadine (PATANOL) 0.1 % ophthalmic solution Place 1 drop into both eyes 2 (two) times daily.    Historical Provider, MD  polyethylene glycol (MIRALAX / GLYCOLAX) packet Take 17 g by mouth daily.     Historical Provider, MD  potassium chloride (KLOR-CON) 10 MEQ CR tablet Take 10 mEq by mouth daily.     Historical Provider, MD  raloxifene (EVISTA) 60 MG tablet Take 60 mg by mouth daily.      Historical Provider, MD  simvastatin (ZOCOR) 40 MG tablet Take 40 mg by mouth at bedtime.      Historical Provider, MD  sucralfate (CARAFATE) 1 GM/10ML suspension Take 1 g by mouth 3 (three) times daily.     Historical Provider, MD  traMADol-acetaminophen (ULTRACET) 37.5-325 MG per tablet Take 2 tablets by mouth 3 (three) times daily.     Historical Provider, MD  triamcinolone cream (KENALOG) 0.1 % Apply 1 application topically 2 (two) times daily.    Historical Provider, MD  VOLTAREN 1 % GEL Apply 2 g topically 4 (four) times daily.  04/09/13   Historical Provider, MD  warfarin (COUMADIN) 5 MG tablet Take 2.5-5 mg by mouth See admin instructions. 1 tablet (5 mg) daily EXCEPT for 1/2 tablet (2.5 mg) on T/Th/Sun    Historical Provider, MD   BP 109/44  Pulse 73  Temp(Src) 98.1 F (36.7 C) (Oral)  Resp 18  SpO2 95% Physical Exam  Nursing note and vitals reviewed. Constitutional: She appears well-developed and well-nourished. No distress.  HENT:  Patient has an adherent clot to left upper first molar socket without active bleeding.  She has oozing from right lower first molar  Cardiovascular: Intact distal pulses.  Exam reveals no gallop and no friction rub.    Murmur heard. Irregular irregular  Pulmonary/Chest: Effort normal and breath sounds normal. No respiratory distress. She has no wheezes. She has no rales. She exhibits no tenderness.  Skin: Skin is warm and dry. No rash noted. No erythema. No pallor.  Psychiatric: She has a normal mood and affect. Her behavior is normal. Judgment and thought content normal.    ED Course  Procedures (including critical care time) Labs Review Labs Reviewed  BASIC METABOLIC  PANEL - Abnormal; Notable for the following:    Glucose, Bld 117 (*)    Creatinine, Ser 1.26 (*)    GFR calc non Af Amer 40 (*)    GFR calc Af Amer 46 (*)    All other components within normal limits  CBC WITH DIFFERENTIAL - Abnormal; Notable for the following:    RBC 3.38 (*)    Hemoglobin 10.0 (*)    HCT 31.1 (*)    Platelets 127 (*)    All other components within normal limits  PROTIME-INR - Abnormal; Notable for the following:    Prothrombin Time 16.7 (*)    All other components within normal limits  TROPONIN I  DIGOXIN LEVEL  TYPE AND SCREEN    Imaging Review No results found.   EKG Interpretation   Date/Time:  Tuesday February 01 2014 01:23:44 EDT Ventricular Rate:  107 PR Interval:    QRS Duration: 84 QT Interval:  308 QTC Calculation: 411 R Axis:   93 Text Interpretation:  Atrial flutter Consider RVH w/ secondary repol  abnormality Repol abnrm, severe global ischemia (LM/MVD) increased ST  depression Confirmed by Dasani Crear  MD, Sanika Brosious ([id number]) on 02/01/2014 1:28:53 AM      MDM   Final diagnoses:  Bleeding gums after dental procedure    80 year old female with persistent oozing from dental extraction.  Plan for attempt to stop bleeding with a T. bag.  Labs to be checked.  3:43 AM Pt with major bleeding resolved with tea bag.  Some oozing at tooth/gum line anteriorly, now resolved with thrombipad.  Will d/c home.   Kalman Drape, MD 02/01/14 0346  5:50 AM Pt unable to find ride.  During wait, pt  developed more bleeding.  Again improved with tea bag, but has had recurrence.  Will try oral amicar, d/w dentist.  Kalman Drape, MD 02/01/14 [phone]

## 2014-02-01 NOTE — ED Notes (Signed)
Attempted call home to son, x2.

## 2014-02-01 NOTE — ED Notes (Signed)
Here by [facility] (transport only) for continual/ recurrent bleeding s/p tooth extraction x4. Has subsequently recently been seen for the same with recent suturing of sockets and transfusion. Pt takes coumadin and ASA and continues to take meds. Pt has had all meds for today and tonight. C/o jaw pain, HA and nausea. (denies: sob or dizziness), alert, NAD, calm, interactive, resps e/u, speaking in clear complete sentences, no dyspnea noted, pt sucking/clamping down on washcloth, bright red blood noted. Pt spitting bright red blood into emesis bag. Tolerating secretions. Patent airway. Skin cool and dry.

## 2014-02-01 NOTE — ED Notes (Signed)
Bleeding restarted. 2nd thrombi pad applied to R lower gums. No changes. Tolerating well. Alert, NAD, calm, interactive, handling secretions. (Denies: nausea, pain or HA).

## 2014-02-01 NOTE — Discharge Instructions (Signed)
Please call your dentist TODAY!! Follow up as instructed on your discharge paperwork from your hospital stay.  Continue your current medications.  Continue using moistened tea bag for any further oozing.

## 2014-02-01 NOTE — ED Notes (Signed)
Mouth continues to bleed/ooze. Pt tolerating secretions. VSS. Remains on O2 [place] 2L for comfort and intermitant low SPO2 readings d/t mouth closed, and bleeding.  Dr. Sharol Given in to see.

## 2014-02-01 NOTE — ED Notes (Signed)
Main lab contacted about delay in troponin and digoxin levels

## 2014-02-24 ENCOUNTER — Encounter: Payer: Self-pay | Admitting: Vascular Surgery

## 2014-02-25 ENCOUNTER — Encounter: Payer: Self-pay | Admitting: Vascular Surgery

## 2014-02-25 ENCOUNTER — Telehealth: Payer: Self-pay | Admitting: *Deleted

## 2014-02-25 ENCOUNTER — Ambulatory Visit (INDEPENDENT_AMBULATORY_CARE_PROVIDER_SITE_OTHER): Payer: Medicare Other | Admitting: Vascular Surgery

## 2014-02-25 ENCOUNTER — Ambulatory Visit (HOSPITAL_COMMUNITY)
Admission: RE | Admit: 2014-02-25 | Discharge: 2014-02-25 | Disposition: A | Discharge status: Home / Self Care | Payer: Medicare Other | Source: Ambulatory Visit | Attending: Vascular Surgery | Admitting: Vascular Surgery

## 2014-02-25 VITALS — BP 119/58 | HR 105 | Ht 62.0 in | Wt 152.0 lb

## 2014-02-25 DIAGNOSIS — I825Y9 Chronic embolism and thrombosis of unspecified deep veins of unspecified proximal lower extremity: Secondary | ICD-10-CM | POA: Insufficient documentation

## 2014-02-25 DIAGNOSIS — I83893 Varicose veins of bilateral lower extremities with other complications: Secondary | ICD-10-CM | POA: Insufficient documentation

## 2014-02-25 DIAGNOSIS — I5032 Chronic diastolic (congestive) heart failure: Secondary | ICD-10-CM | POA: Diagnosis not present

## 2014-02-25 DIAGNOSIS — I509 Heart failure, unspecified: Secondary | ICD-10-CM | POA: Insufficient documentation

## 2014-02-25 DIAGNOSIS — Z7901 Long term (current) use of anticoagulants: Secondary | ICD-10-CM | POA: Diagnosis not present

## 2014-02-25 DIAGNOSIS — M7989 Other specified soft tissue disorders: Secondary | ICD-10-CM

## 2014-02-25 DIAGNOSIS — I872 Venous insufficiency (chronic) (peripheral): Secondary | ICD-10-CM

## 2014-02-25 NOTE — Progress Notes (Signed)
CONSULT   CC:  Leg swelling  Vyas, Dhruv B., MD  HPI: This is a 80 y.o. female who presents today for swelling of her legs with the left being worse than the right.  She states she does have varicosities.  She also states she has an ulcer on the left lower leg for many years that will worsen with increased leg swelling.  She states that she has had 10 pregnancies.  She does have complaints of numbness in her feet and problems moving her foot up and down.  She has been told that she has arthritis in her knees.  She does have a hx of DVT back in 1962 in the left leg.  She is referred for further evaluation for varicosities and leg swelling.  Her sister has a hx of varicosities.  She does have diabetes and takes Glucotrol.  She is on coumadin for hx of mechanical mitral heart valve and hx of atrial fibrillation.  She also has a hx of a tricuspid valve replacement with bioprosthetic valve.  She is also on medication for HTN.      Past Medical History  Diagnosis Date  . Chronic atrial fibrillation     currently normal sinus rhythm  . Chronic anticoagulation   . Irritable bowel syndrome     constipation  . Atypical chest pain     Negative catheterization 2002, low risk Cardiolite study November 2011  . Moderate aortic stenosis by prior echocardiogram      asymptomatic  . Depression   . LV dysfunction     echocardiogram March 2013 by her primary care physician ejection fraction 40-45%, moderate aortic stenosis, prosthetic valves poorly reported , small pericardial effusion.  . Intermittent claudication     NL ABIs, 12/2011  . Myocardial infarction 1962  . DVT (deep venous thrombosis) 1962    LLE  . Heart murmur   . Type II diabetes mellitus   . Anemia   . History of blood transfusion 1971    "got real sick during pregnancy"  . GERD (gastroesophageal reflux disease)   . Migraine     "a few times/yr" (01/26/2014)  . Arthritis     "knees, back" (01/26/2014)  . Osteoporosis   .  Chronic lower back pain   . Anxiety   . Nephrolithiasis     "I have a large stone in my left kidney" (01/26/2014)  . Skin cancer   . CAD (coronary artery disease)   . Varicose veins    Past Surgical History  Procedure Laterality Date  . Multiple tooth extractions  01/25/2014    "had 4 pulled"  . Cholecystectomy    . Appendectomy    . Hernia repair    . Umbilical hernia repair    . Tricuspid valve replacement  10/2000    bioprosthetic valve  . Patent foramen ovale closure  1971  . Mitral valve replacement  10/2000     St. Jude's bileaflet prosthesis  . Cardiac catheterization  ~ 3 times  . Skin cancer excision      "off my nose"  . Abdominal hysterectomy  1970's    Allergies  Allergen Reactions  . Oxycodone-Acetaminophen     REACTION: gi upset    Current Outpatient Prescriptions  Medication Sig Dispense Refill  . ALPRAZolam (XANAX) 0.5 MG tablet Take 0.5 mg by mouth 3 (three) times daily.       Marland Kitchen dexlansoprazole (DEXILANT) 60 MG capsule Take 60 mg by mouth daily.      Marland Kitchen  fexofenadine (ALLEGRA) 180 MG tablet Take 180 mg by mouth daily.      . furosemide (LASIX) 40 MG tablet Take 40 mg by mouth daily.      Marland Kitchen glipiZIDE (GLUCOTROL XL) 2.5 MG 24 hr tablet Take 2.5 mg by mouth daily.      Marland Kitchen levocetirizine (XYZAL) 5 MG tablet Take 1 tablet by mouth daily.      . metoprolol succinate (TOPROL-XL) 25 MG 24 hr tablet Take 3 tablets (75 mg total) by mouth daily. Take with or immediately following a meal.  30 tablet  1  . olopatadine (PATANOL) 0.1 % ophthalmic solution Place 1 drop into both eyes 2 (two) times daily.      . polyethylene glycol (MIRALAX / GLYCOLAX) packet Take 17 g by mouth daily.       . potassium chloride (KLOR-CON) 10 MEQ CR tablet Take 10 mEq by mouth daily.       . raloxifene (EVISTA) 60 MG tablet Take 60 mg by mouth daily.        . simvastatin (ZOCOR) 40 MG tablet Take 40 mg by mouth at bedtime.        . sucralfate (CARAFATE) 1 GM/10ML suspension Take 1 g by mouth 3  (three) times daily.       . traMADol-acetaminophen (ULTRACET) 37.5-325 MG per tablet Take 2 tablets by mouth 3 (three) times daily.       Marland Kitchen triamcinolone cream (KENALOG) 0.1 % Apply 1 application topically 2 (two) times daily.      . VOLTAREN 1 % GEL Apply 2 g topically 4 (four) times daily.       Marland Kitchen warfarin (COUMADIN) 5 MG tablet Take 2.5-5 mg by mouth See admin instructions. 1 tablet (5 mg) daily EXCEPT for 1/2 tablet (2.5 mg) on T/Th/Sun      . aspirin 81 MG chewable tablet Chew 1 tablet (81 mg total) by mouth daily.      . digoxin (LANOXIN) 0.125 MG tablet Take 1 tablet (0.125 mg total) by mouth daily.  30 tablet  0  . enoxaparin (LOVENOX) 120 MG/0.8ML injection Inject 0.77 mLs (115 mg total) into the skin daily.  5 Syringe  0   No current facility-administered medications for this visit.    Family History  Problem Relation Age of Onset  . Diabetes Mother   . CAD Mother   . Heart disease Mother     before age 35  . Heart failure Father   . Heart disease Father     before age 44  . Stroke Sister 32  . Cancer Sister   . Varicose Veins Sister   . Heart attack Sister   . Cancer Brother   . Cancer Daughter   . Heart disease Daughter     before age 33  . Heart disease Son     before age 46  . Hyperlipidemia Son   . Heart attack Son   . Cancer Sister   . Cancer Sister   . Cancer Brother     History   Social History  . Marital Status: Married    Spouse Name: N/A    Number of Children: N/A  . Years of Education: N/A   Occupational History  . Not on file.   Social History Main Topics  . Smoking status: Never Smoker   . Smokeless tobacco: Never Used  . Alcohol Use: No  . Drug Use: No  . Sexual Activity: No   Other Topics Concern  .  Not on file   Social History Narrative  . No narrative on file     ROS: [x]  Positive   [ ]  Negative   [ ]  All sytems reviewed and are negative  Cardiovascular: [x]  chest pain/pressure []  palpitations [x]  SOB  [x]  DOE []  pain  in legs while walking []  pain in feet when lying flat []  hx of DVT []  hx of phlebitis [x]  swelling in legs-left greater than right [x]  varicose veins  Pulmonary: []  productive cough []  asthma []  wheezing  Neurologic: []  weakness in []  arms []  legs []  numbness in []  arms []  legs [] difficulty speaking or slurred speech []  temporary loss of vision in one eye []  dizziness  Hematologic: []  bleeding problems []  problems with blood clotting easily  GI []  vomiting blood []  blood in stool  GU: []  burning with urination []  blood in urine  Psychiatric: []  hx of major depression  Integumentary: []  rashes []  ulcers  Constitutional: []  fever []  chills   PHYSICAL EXAMINATION:  Filed Vitals:   02/25/14 1422  BP: 119/58  Pulse: 105   Body mass index is 27.79 kg/(m^2).  General:  WDWN in NAD Gait: Not observed HENT: WNL, normocephalic Eyes: Pupils equal Pulmonary: normal non-labored breathing , without Rales, rhonchi,  wheezing Cardiac: irregular, with click.  with carotid bruits Abdomen: soft, NT, no masses Skin: without rashes, with healed ulcer left leg just anterior to the medial malleolus.  Vascular Exam/Pulses:  Right Left  Radial 2+ (normal) 2+ (normal)  Ulnar Unable to palpate Unable to palpate  DP 2+ (normal) 2+ (normal)  PT Unable to palpate Unable to palpate   Extremities: without ischemic changes, without Gangrene , without cellulitis; without open wounds; there are combination of spider veins and varicosities right and left leg; she does have a healed venous stasis ulcer left lower leg Musculoskeletal: no muscle wasting or atrophy  Neurologic: A&O X 3; Appropriate Affect ; SENSATION: normal; MOTOR FUNCTION:  moving all extremities equally. Speech is fluent/normal Psychiatric: Judgment intact, Mood & affect appropriate for pt's clinical situation Lymph : No Cervical, Axillary, or Inguinal lymphadenopathy   Non-Invasive Vascular Imaging:  Lower  extremity venous reflux evaluation 02/25/14: Left: -No deep vein obstruction -there is deep vein reflux in CFV & popliteal -there is superficial thrombophlebitis with chronic partial thrombus + GSV reflux  Right: -No deep vein obstruction -No deep vein reflux -No superficial thrombophlebitis + distal GSV reflux  Pt meds includes: Statin:  Yes.   Beta Blocker:  Yes.   Aspirin:  No. ACEI:  No. ARB:  No. Other Antiplatelet/Anticoagulant:  Yes.  -Coumadin   ASSESSMENT/PLAN:: 80 y.o. female with chronic venous insufficiency   -The pt does have deep vein reflux of the left CFV and popliteal vein and superficial thromobosis with chronic partial thrombus. -She does have swelling in her BLE with left greater than right with hx of DVT in left leg as well as a healed chronic insufficiency ulcer on the left lower leg. -she does have swelling/pain despite wearing knee high compression stockings. -recommend thigh high compression stockings with a gradient of 20-30 mm Hg for 3 months as well as leg elevation. -Will have her f/u in the vein clinic in 3 months  -she is on coumadin for a mechanical mitral valve as well as chronic atrial fibrillation.    Leontine Locket, PA-C [facility] [phone]  Clinic MD:  Pt seen and examined in conjunction with Dr. Bridgett Larsson  Addendum  I have independently interviewed  and examined the patient, and I agree with the physician assistant's findings.  Pt has B CVI with sx varicosities in the L leg.  She has some evidence of partial occlusion of the L GSV, but may benefit with stab phlebectomy vs EVLA to eliminate the reflux in that GSV.  The patient will undergo 3 month trial of compressive therapy and then follow up for further evaluation in the [facility].  Adele Barthel, MD [facility] [place] Office: [phone] Pager: [phone]  02/25/2014, 5:31 PM

## 2014-02-25 NOTE — Telephone Encounter (Signed)
Probably a psychiatric component. Symptoms appear to be stable with no weight gain and no worsening of SOB from baseline. Would continue to monitor symptoms for now and inform of [place] if there is a marked change in clinical status (leg swelling, worsening SOB from baseline).

## 2014-02-25 NOTE — Telephone Encounter (Signed)
Daughter Hassan Rowan) called at the request of her mother.   Stated she felt like she needed to be checked out by [place].  Did see her PMD 2 weeks ago & he suggested additional fluid medication for a brief time, then back to previous.  Did not suggest anything else.  Continued c/o SOB.  Further discussion with daughter states that this is not new.  No c/o chest pain, dizziness, or weight gain.  No increased SOB with activity either.  Reviewed last OV note with daughter including low risk Lexiscan.  Also, multiple issues of fatigue, depression & dealing with really sick husband.

## 2014-02-28 NOTE — Telephone Encounter (Signed)
Spoke with aide - daughter Hassan Rowan) has taken patient to see PMD currently.  She will give her message to call back this afternoon.

## 2014-03-01 NOTE — Telephone Encounter (Signed)
Daughter Hassan Rowan) returned call - stated that she did take her to PMD yesterday.  Did not have any fluid, but was told to follow up with [place].  Discussed below with daughter & not sure what more Dr. Bronson Ing will do at this point.  Daughter stated that she felt that seeing Dr. Raliegh Ip would reassure her that she was okay.

## 2014-03-01 NOTE — Telephone Encounter (Signed)
Appointment scheduled for 03/08/2014 at 1:40 with Dr. Bronson Ing.  Attempted to notify daughter - busy.

## 2014-03-07 NOTE — Telephone Encounter (Signed)
Patient aware.

## 2014-03-08 ENCOUNTER — Ambulatory Visit (INDEPENDENT_AMBULATORY_CARE_PROVIDER_SITE_OTHER): Payer: Medicare Other | Admitting: Cardiovascular Disease

## 2014-03-08 ENCOUNTER — Encounter: Payer: Self-pay | Admitting: Cardiovascular Disease

## 2014-03-08 VITALS — BP 108/63 | HR 71 | Ht 62.0 in | Wt 153.5 lb

## 2014-03-08 DIAGNOSIS — R0602 Shortness of breath: Secondary | ICD-10-CM

## 2014-03-08 DIAGNOSIS — I482 Chronic atrial fibrillation, unspecified: Secondary | ICD-10-CM

## 2014-03-08 DIAGNOSIS — F329 Major depressive disorder, single episode, unspecified: Secondary | ICD-10-CM

## 2014-03-08 DIAGNOSIS — I4891 Unspecified atrial fibrillation: Secondary | ICD-10-CM

## 2014-03-08 DIAGNOSIS — R0789 Other chest pain: Secondary | ICD-10-CM

## 2014-03-08 DIAGNOSIS — R5381 Other malaise: Secondary | ICD-10-CM

## 2014-03-08 DIAGNOSIS — I350 Nonrheumatic aortic (valve) stenosis: Secondary | ICD-10-CM

## 2014-03-08 DIAGNOSIS — R5383 Other fatigue: Secondary | ICD-10-CM

## 2014-03-08 DIAGNOSIS — I359 Nonrheumatic aortic valve disorder, unspecified: Secondary | ICD-10-CM

## 2014-03-08 DIAGNOSIS — Z952 Presence of prosthetic heart valve: Secondary | ICD-10-CM

## 2014-03-08 DIAGNOSIS — F32A Depression, unspecified: Secondary | ICD-10-CM

## 2014-03-08 DIAGNOSIS — Z954 Presence of other heart-valve replacement: Secondary | ICD-10-CM

## 2014-03-08 DIAGNOSIS — I8390 Asymptomatic varicose veins of unspecified lower extremity: Secondary | ICD-10-CM

## 2014-03-08 DIAGNOSIS — F3289 Other specified depressive episodes: Secondary | ICD-10-CM

## 2014-03-08 DIAGNOSIS — I872 Venous insufficiency (chronic) (peripheral): Secondary | ICD-10-CM

## 2014-03-08 NOTE — Progress Notes (Signed)
Patient ID: Elizabeth Wallace, female   DOB: 1934-02-16, 80 y.o.   MRN: [id number]      SUBJECTIVE: Elizabeth Wallace has a cardiac history which is notable for permanent atrial fibrillation and valvular heart disease, status post mechanical MVR and bioprosthetic TVR. She also has history of atypical CP with a negative catheterization in 2002. She has chronic peripheral edema with venous varicosities. She also has depression, and takes care of her disabled husband.  Her echo in 01/2014 demonstrated normal LV systolic function, mild AS and moderate AI, and normally functioning prosthetic mitral and tricuspid valves albeit poorly visualized.  She underwent a nuclear stress test for fatigue and exertional dyspnea on 11/13/13 which was deemed low risk, demonstrating a small very mild partially reversible anteroseptal wall defect with normal wall motion, potentially due to variation in  breast positioning, but a small area of ischemia could not entirely be ruled out. LVEF 77%.  She was hospitalized for bleeding gums after dental procedure in August and was in rapid atrial fibrillation deemed secondary to anemia. She is here with her daughter, Elizabeth Wallace, who's husband Elizabeth Wallace is also my patient.  The patient has occasional chest pains. Her primary complaint is dyspnea with minimal exertion and orthopnea.  She has been wearing compression stockings and leg swelling has remained stable. She was exposed to secondhand smoke for a long part of her life.   Soc: Husband, Elizabeth Wallace, who is also my patient, was diagnosed with lung cancer.    Review of Systems: As per "subjective", otherwise negative.  Allergies  Allergen Reactions  . Oxycodone-Acetaminophen     REACTION: gi upset    Current Outpatient Prescriptions  Medication Sig Dispense Refill  . ALPRAZolam (XANAX) 0.5 MG tablet Take 0.5 mg by mouth 3 (three) times daily.       . metoprolol succinate (TOPROL-XL) 50 MG 24 hr tablet Take 50 mg by mouth  daily.       . cefUROXime (CEFTIN) 500 MG tablet       . dexlansoprazole (DEXILANT) 60 MG capsule Take 60 mg by mouth daily.      . digoxin (LANOXIN) 0.125 MG tablet Take 1 tablet (0.125 mg total) by mouth daily.  30 tablet  0  . donepezil (ARICEPT) 5 MG tablet       . enoxaparin (LOVENOX) 120 MG/0.8ML injection Inject 0.77 mLs (115 mg total) into the skin daily.  5 Syringe  0  . fexofenadine (ALLEGRA) 180 MG tablet Take 180 mg by mouth daily.      . furosemide (LASIX) 40 MG tablet Take 40 mg by mouth daily.      Marland Kitchen glipiZIDE (GLUCOTROL XL) 2.5 MG 24 hr tablet Take 2.5 mg by mouth daily.      Marland Kitchen HYDROcodone-acetaminophen (NORCO/VICODIN) 5-325 MG per tablet       . levocetirizine (XYZAL) 5 MG tablet Take 1 tablet by mouth daily.      Marland Kitchen olopatadine (PATANOL) 0.1 % ophthalmic solution Place 1 drop into both eyes 2 (two) times daily.      . polyethylene glycol (MIRALAX / GLYCOLAX) packet Take 17 g by mouth daily.       . polyethylene glycol powder (GLYCOLAX/MIRALAX) powder       . potassium chloride (K-DUR) 10 MEQ tablet       . potassium chloride (KLOR-CON) 10 MEQ CR tablet Take 10 mEq by mouth daily.       . predniSONE (DELTASONE) 10 MG tablet       .  raloxifene (EVISTA) 60 MG tablet Take 60 mg by mouth daily.        . simvastatin (ZOCOR) 40 MG tablet Take 40 mg by mouth at bedtime.        . sucralfate (CARAFATE) 1 GM/10ML suspension Take 1 g by mouth 3 (three) times daily.       . traMADol-acetaminophen (ULTRACET) 37.5-325 MG per tablet Take 2 tablets by mouth 3 (three) times daily.       Marland Kitchen triamcinolone cream (KENALOG) 0.1 % Apply 1 application topically 2 (two) times daily.      . VOLTAREN 1 % GEL Apply 2 g topically 4 (four) times daily.       Marland Kitchen warfarin (COUMADIN) 5 MG tablet Take 2.5-5 mg by mouth See admin instructions. 1 tablet (5 mg) daily EXCEPT for 1/2 tablet (2.5 mg) on T/Th/Sun       No current facility-administered medications for this visit.    Past Medical History  Diagnosis  Date  . Chronic atrial fibrillation     currently normal sinus rhythm  . Chronic anticoagulation   . Irritable bowel syndrome     constipation  . Atypical chest pain     Negative catheterization 2002, low risk Cardiolite study November 2011  . Moderate aortic stenosis by prior echocardiogram      asymptomatic  . Depression   . LV dysfunction     echocardiogram March 2013 by her primary care physician ejection fraction 40-45%, moderate aortic stenosis, prosthetic valves poorly reported , small pericardial effusion.  . Intermittent claudication     NL ABIs, 12/2011  . Myocardial infarction 1962  . DVT (deep venous thrombosis) 1962    LLE  . Heart murmur   . Type II diabetes mellitus   . Anemia   . History of blood transfusion 1971    "got real sick during pregnancy"  . GERD (gastroesophageal reflux disease)   . Migraine     "a few times/yr" (01/26/2014)  . Arthritis     "knees, back" (01/26/2014)  . Osteoporosis   . Chronic lower back pain   . Anxiety   . Nephrolithiasis     "I have a large stone in my left kidney" (01/26/2014)  . Skin cancer   . CAD (coronary artery disease)   . Varicose veins     Past Surgical History  Procedure Laterality Date  . Multiple tooth extractions  01/25/2014    "had 4 pulled"  . Cholecystectomy    . Appendectomy    . Hernia repair    . Umbilical hernia repair    . Tricuspid valve replacement  10/2000    bioprosthetic valve  . Patent foramen ovale closure  1971  . Mitral valve replacement  10/2000     [employer]. [employer]'s bileaflet prosthesis  . Cardiac catheterization  ~ 3 times  . Skin cancer excision      "off my nose"  . Abdominal hysterectomy  1970's    History   Social History  . Marital Status: Married    Spouse Name: N/A    Number of Children: N/A  . Years of Education: N/A   Occupational History  . Not on file.   Social History Main Topics  . Smoking status: Never Smoker   . Smokeless tobacco: Never Used  . Alcohol Use: No  .  Drug Use: No  . Sexual Activity: No   Other Topics Concern  . Not on file   Social History Narrative  . No  narrative on file     Filed Vitals:   03/08/14 1331  Weight: 153 lb 8 oz (69.627 kg)    PHYSICAL EXAM General: NAD, flat affect  Neck: No JVD, no thyromegaly or thyroid nodule.  Lungs: Clear to auscultation bilaterally with normal respiratory effort.  CV: Nondisplaced PMI. Mostly regular with some mild irregularlity, S1 click/S2, no S3, II/VI mid-peaking systolic murmur loudest at RUSB. No peripheral edema, wearing compression stockings. Abdomen: Soft, nontender, no hepatosplenomegaly, no distention.  Neurologic: Alert and oriented. Psych: Flat affect. Skin: Normal. Musculoskeletal: Normal range of motion, no gross deformities. Extremities: No clubbing or cyanosis.   ECG: Most recent ECG reviewed.   Echo (01/26/14): - Left ventricle: The cavity size was normal. Systolic function was normal. The estimated ejection fraction was in the range of 55% to 60%. Wall motion was normal; there were no regional wall motion abnormalities. - Aortic valve: Severe thickening and calcification, consistent with sclerosis. Valve mobility was restricted. There was mild stenosis. There was moderate regurgitation. Valve area (VTI): 1.26 cm^2. Valve area (Vmax): 1.24 cm^2. Valve area (Vmean): 1.35 cm^2. - Mitral valve: A mechanical prosthesis was present. Regurgitation could not be evaluated due to acoustic artifact from the prosthesis. Valve area by continuity equation (using LVOT flow): 2.3 cm^2. - Right ventricle: The cavity size was moderately dilated. Wall thickness was normal. Systolic function was moderately reduced. - Tricuspid valve: Poorly visualized s/p TV replacement.    ASSESSMENT AND PLAN: 1. Permanent atrial fibrillation: Rate controlled, on warfarin. Continue Toprol-XL 50 mg daily.  2. S/p MVR and TVR: Stable as per 01/2014 echo.  3. LV dysfunction: Resolved,  evidenced by echocardiogram in November 2014 and confirmed again in August 2015.  4. Lower extremity swelling: She has venous insufficiency, and I recommend continued use of support hose and leg elevation with pillows while sleeping. I previously made a referral to vascular surgery and she is now followed by them. 5. Aortic stenosis: Mild by echo in 01/2014, with moderate AI.  6. Shortness of breath and fatigue: Will obtain PFT's given prolonged exposure to secondhand smoke.  Dispo: f/u 6 months.  Kate Sable, M.D., F.A.C.C.

## 2014-03-08 NOTE — Patient Instructions (Signed)
Your physician recommends that you schedule a follow-up appointment in: 6 months. You will receive a reminder letter in the mail in about 4 months reminding you to call and schedule your appointment. If you don't receive this letter, please contact our office. Your physician recommends that you continue on your current medications as directed. Please refer to the Current Medication list given to you today. Your physician has recommended that you have a pulmonary function test. Pulmonary Function Tests are a group of tests that measure how well air moves in and out of your lungs.

## 2014-03-15 ENCOUNTER — Ambulatory Visit (HOSPITAL_COMMUNITY)
Admission: RE | Admit: 2014-03-15 | Discharge: 2014-03-15 | Disposition: A | Discharge status: Home / Self Care | Payer: Medicare Other | Source: Ambulatory Visit | Attending: Cardiovascular Disease | Admitting: Cardiovascular Disease

## 2014-03-15 DIAGNOSIS — R0602 Shortness of breath: Secondary | ICD-10-CM | POA: Insufficient documentation

## 2014-03-15 LAB — PULMONARY FUNCTION TEST
FEF 25-75 PRE: 0.61 L/s
FEF 25-75 Post: 0.72 L/sec
FEF2575-%Change-Post: 19 %
FEF2575-%PRED-POST: 51 %
FEF2575-%PRED-PRE: 43 %
FEV1-%Change-Post: 3 %
FEV1-%Pred-Post: 46 %
FEV1-%Pred-Pre: 44 %
FEV1-PRE: 0.81 L
FEV1-Post: 0.85 L
FEV1FVC-%Change-Post: 4 %
FEV1FVC-%PRED-PRE: 102 %
FEV6-%CHANGE-POST: 0 %
FEV6-%PRED-POST: 45 %
FEV6-%Pred-Pre: 46 %
FEV6-POST: 1.06 L
FEV6-Pre: 1.07 L
FEV6FVC-%Pred-Post: 106 %
FEV6FVC-%Pred-Pre: 106 %
FVC-%CHANGE-POST: 0 %
FVC-%PRED-POST: 43 %
FVC-%PRED-PRE: 43 %
FVC-POST: 1.06 L
FVC-PRE: 1.07 L
PRE FEV1/FVC RATIO: 76 %
Post FEV1/FVC ratio: 80 %
Post FEV6/FVC ratio: 100 %
Pre FEV6/FVC Ratio: 100 %
RV % pred: 116 %
RV: 2.62 L
TLC % pred: 75 %
TLC: 3.59 L

## 2014-03-15 MED ORDER — ALBUTEROL SULFATE (2.5 MG/3ML) 0.083% IN NEBU
2.5000 mg | INHALATION_SOLUTION | Freq: Once | RESPIRATORY_TRACT | Status: AC
  Administered 2014-03-15: 2.5 mg via RESPIRATORY_TRACT

## 2014-03-16 ENCOUNTER — Telehealth: Payer: Self-pay | Admitting: Cardiovascular Disease

## 2014-03-16 DIAGNOSIS — J988 Other specified respiratory disorders: Secondary | ICD-10-CM

## 2014-03-16 DIAGNOSIS — R0602 Shortness of breath: Secondary | ICD-10-CM

## 2014-03-16 NOTE — Telephone Encounter (Signed)
Results given to daughter. Vicky will c/b to schedule appt to pulmonary.

## 2014-03-16 NOTE — Telephone Encounter (Signed)
Mrs. Rosinski called stating that she continues to be short of breath. Wanting to know if Dr. Bronson Ing  Can advise and requesting to know test results of the PFT's.

## 2014-03-24 ENCOUNTER — Other Ambulatory Visit (INDEPENDENT_AMBULATORY_CARE_PROVIDER_SITE_OTHER): Payer: Medicare Other

## 2014-03-24 ENCOUNTER — Ambulatory Visit (INDEPENDENT_AMBULATORY_CARE_PROVIDER_SITE_OTHER): Payer: Medicare Other | Admitting: Pulmonary Disease

## 2014-03-24 ENCOUNTER — Encounter: Payer: Self-pay | Admitting: Pulmonary Disease

## 2014-03-24 ENCOUNTER — Ambulatory Visit
Admission: RE | Admit: 2014-03-24 | Discharge: 2014-03-24 | Disposition: A | Discharge status: Home / Self Care | Payer: Medicare Other | Source: Ambulatory Visit | Attending: Pulmonary Disease | Admitting: Pulmonary Disease

## 2014-03-24 VITALS — BP 114/60 | HR 85 | Ht 62.0 in | Wt 161.0 lb

## 2014-03-24 DIAGNOSIS — R0602 Shortness of breath: Secondary | ICD-10-CM

## 2014-03-24 DIAGNOSIS — D649 Anemia, unspecified: Secondary | ICD-10-CM

## 2014-03-24 DIAGNOSIS — D6489 Other specified anemias: Secondary | ICD-10-CM | POA: Insufficient documentation

## 2014-03-24 DIAGNOSIS — J984 Other disorders of lung: Secondary | ICD-10-CM

## 2014-03-24 DIAGNOSIS — J449 Chronic obstructive pulmonary disease, unspecified: Secondary | ICD-10-CM

## 2014-03-24 LAB — CBC WITH DIFFERENTIAL/PLATELET
Basophils Absolute: 0 10*3/uL (ref 0.0–0.1)
Basophils Relative: 0.4 % (ref 0.0–3.0)
EOS ABS: 0.1 10*3/uL (ref 0.0–0.7)
EOS PCT: 1.5 % (ref 0.0–5.0)
HCT: 29.3 % — ABNORMAL LOW (ref 36.0–46.0)
Hemoglobin: 9.3 g/dL — ABNORMAL LOW (ref 12.0–15.0)
Lymphocytes Relative: 15.2 % (ref 12.0–46.0)
Lymphs Abs: 0.9 10*3/uL (ref 0.7–4.0)
MCHC: 31.8 g/dL (ref 30.0–36.0)
MCV: 81.6 fl (ref 78.0–100.0)
MONO ABS: 0.3 10*3/uL (ref 0.1–1.0)
Monocytes Relative: 5.5 % (ref 3.0–12.0)
Neutro Abs: 4.5 10*3/uL (ref 1.4–7.7)
Neutrophils Relative %: 77.4 % — ABNORMAL HIGH (ref 43.0–77.0)
PLATELETS: 189 10*3/uL (ref 150.0–400.0)
RBC: 3.59 Mil/uL — ABNORMAL LOW (ref 3.87–5.11)
RDW: 16.3 % — ABNORMAL HIGH (ref 11.5–15.5)
WBC: 5.8 10*3/uL (ref 4.0–10.5)

## 2014-03-24 NOTE — Assessment & Plan Note (Signed)
Plan to her today that her dyspnea is complicated by her normocytic anemia. Clearly, in the last several months she has struggled with this after the complication from her tooth extraction. Based on my review of her lab work she remains quite anemic. She and her daughter are unaware of any sort of blood loss anemia aside from the known incident back in August.  On review of her RBC indices it looks as if she is normocytic but her RDW is elevated which I think accounts for the fact that she has had some blood loss and her current RBC indices are reflective of her recent blood transfusions.  Plan: -Request PCP records to see if the occult blood has been tested in stool -Reticulocyte count, B12, folate, TSH, ferritin, cbc

## 2014-03-24 NOTE — Assessment & Plan Note (Addendum)
Elizabeth Wallace describes worsening shortness of breath in the last several months. Objectively, she has normal lungs, and normal oximetry on ambulation. However, her lung function testing is significant for mild restriction with airflow obstruction. Airflow obstruction appears to be most significant in the small airways and she does appear to have air trapping with the elevated residual volume. Considering the fact that she has never smoked I do not feel that this is indicative of COPD. The syndrome could be consistent with chronic obstructive asthma but that would be unusual considering her lack of environmental triggers and her advanced age. 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  I am happy that she gets benefit from albuterol, I hope this means that she'll continue to get symptomatic benefit from a long-acting bronchodilator and inhaled corticosteroid.  Plan: -Test for rheumatoid arthritis with blood work -[facility] -CXR -If blood work is suggestive of rheumatoid arthritis then with obtaining a CT chest to look for interstitial lung disease.

## 2014-03-24 NOTE — Progress Notes (Signed)
Subjective:    Patient ID: Elizabeth Wallace, female    DOB: December 18, 1933, 80 y.o.   MRN: [id number]  HPI Chief Complaint  Patient presents with  . Advice Only    Referred by Dr. Jacinta Shoe for increased SOB Xfew weeks.    Elizabeth Wallace has been sent to me for evaluation of shortness of breath.  She has been using an inhaler lately which helps with the dyspnea to some degree.  She has trouble just walking around her house.  She started using proAir and this helps a lot. She has never been told she had a lung problem and she has never smoked.  She has been short of breath for over a year now and for the last few months it has been much worse. She is unable to get comfortable in that she can't lie down without getting short of breath.  She notes that she has been coughing more lately and feels some tightness in her chest and feels chest congestion in the left more than right.  ProAir helps her cough.  The last year has been rough.  She had a tooth extraction complication complicated by massive bleeding requiring transfusion of 2 U PRBC and a hospitalization.    In 1971 she had her mitral valve operated on (she isn't sure of the surgery) and in 2002 she had the mitral and tricuspid valves here at [facility] by Dr. Cyndia Bent.  She has a history of rheumatic fever.  She said that in 1982 she said she had "inflammation of her lungs" and she said she felt like she had the flu. She was hospitalized for 9 days and was told by a pulmonologist that she had inflammation in her lungs.  She doesn't recall what was used for treatement.     Past Medical History  Diagnosis Date  . Chronic atrial fibrillation     currently normal sinus rhythm  . Chronic anticoagulation   . Irritable bowel syndrome     constipation  . Atypical chest pain     Negative catheterization 2002, low risk Cardiolite study November 2011  . Moderate aortic stenosis by prior echocardiogram      asymptomatic  . Depression   . LV  dysfunction     echocardiogram March 2013 by her primary care physician ejection fraction 40-45%, moderate aortic stenosis, prosthetic valves poorly reported , small pericardial effusion.  . Intermittent claudication     NL ABIs, 12/2011  . Myocardial infarction 1962  . DVT (deep venous thrombosis) 1962    LLE  . Heart murmur   . Type II diabetes mellitus   . Anemia   . History of blood transfusion 1971    "got real sick during pregnancy"  . GERD (gastroesophageal reflux disease)   . Migraine     "a few times/yr" (01/26/2014)  . Arthritis     "knees, back" (01/26/2014)  . Osteoporosis   . Chronic lower back pain   . Anxiety   . Nephrolithiasis     "I have a large stone in my left kidney" (01/26/2014)  . Skin cancer   . CAD (coronary artery disease)   . Varicose veins      Family History  Problem Relation Age of Onset  . Diabetes Mother   . CAD Mother   . Heart disease Mother     before age 33  . Heart failure Father   . Heart disease Father     before age 85  .  Stroke Sister 64  . Cancer Sister   . Varicose Veins Sister   . Heart attack Sister   . Cancer Brother   . Cancer Daughter   . Heart disease Daughter     before age 43  . Heart disease Son     before age 36  . Hyperlipidemia Son   . Heart attack Son   . Cancer Sister   . Cancer Sister   . Cancer Brother      History   Social History  . Marital Status: Married    Spouse Name: N/A    Number of Children: N/A  . Years of Education: N/A   Occupational History  . Not on file.   Social History Main Topics  . Smoking status: Never Smoker   . Smokeless tobacco: Never Used  . Alcohol Use: No  . Drug Use: No  . Sexual Activity: No   Other Topics Concern  . Not on file   Social History Narrative  . No narrative on file     Allergies  Allergen Reactions  . Oxycodone-Acetaminophen     REACTION: gi upset     Outpatient Prescriptions Prior to Visit  Medication Sig Dispense Refill  . ALPRAZolam  (XANAX) 0.5 MG tablet Take 0.5 mg by mouth 3 (three) times daily.       . cefUROXime (CEFTIN) 500 MG tablet       . digoxin (LANOXIN) 0.125 MG tablet Take 1 tablet (0.125 mg total) by mouth daily.  30 tablet  0  . donepezil (ARICEPT) 5 MG tablet       . fexofenadine (ALLEGRA) 180 MG tablet Take 180 mg by mouth daily.      . furosemide (LASIX) 40 MG tablet Take 40 mg by mouth daily.      Marland Kitchen glipiZIDE (GLUCOTROL XL) 2.5 MG 24 hr tablet Take 2.5 mg by mouth daily.      Marland Kitchen levocetirizine (XYZAL) 5 MG tablet Take 1 tablet by mouth daily.      . metoprolol succinate (TOPROL-XL) 50 MG 24 hr tablet Take 50 mg by mouth daily.       Marland Kitchen olopatadine (PATANOL) 0.1 % ophthalmic solution Place 1 drop into both eyes 2 (two) times daily.      . polyethylene glycol (MIRALAX / GLYCOLAX) packet Take 17 g by mouth daily.       . potassium chloride (KLOR-CON) 10 MEQ CR tablet Take 10 mEq by mouth daily.       . predniSONE (DELTASONE) 10 MG tablet Take 10 mg by mouth daily as needed.       . raloxifene (EVISTA) 60 MG tablet Take 60 mg by mouth daily.        . simvastatin (ZOCOR) 40 MG tablet Take 40 mg by mouth at bedtime.        . sucralfate (CARAFATE) 1 GM/10ML suspension Take 1 g by mouth 3 (three) times daily.       . traMADol-acetaminophen (ULTRACET) 37.5-325 MG per tablet Take 2 tablets by mouth 3 (three) times daily.       Marland Kitchen triamcinolone cream (KENALOG) 0.1 % Apply 1 application topically 2 (two) times daily.      . VOLTAREN 1 % GEL Apply 2 g topically 4 (four) times daily.       Marland Kitchen warfarin (COUMADIN) 5 MG tablet Take 2.5-5 mg by mouth See admin instructions. 1 tablet (5 mg) daily EXCEPT for 1/2 tablet (2.5 mg) on T/Th/Sun      .  HYDROcodone-acetaminophen (NORCO/VICODIN) 5-325 MG per tablet       . polyethylene glycol powder (GLYCOLAX/MIRALAX) powder        No facility-administered medications prior to visit.       Review of Systems  Constitutional: Negative for fever and unexpected weight change.    HENT: Positive for postnasal drip. Negative for congestion, dental problem, ear pain, nosebleeds, rhinorrhea, sinus pressure, sneezing, sore throat and trouble swallowing.   Eyes: Negative for redness and itching.  Respiratory: Positive for cough and shortness of breath. Negative for chest tightness and wheezing.   Cardiovascular: Negative for palpitations and leg swelling.  Gastrointestinal: Negative for nausea and vomiting.  Genitourinary: Negative for dysuria.  Musculoskeletal: Negative for joint swelling.  Skin: Negative for rash.  Neurological: Negative for headaches.  Hematological: Does not bruise/bleed easily.  Psychiatric/Behavioral: Negative for dysphoric mood. The patient is not nervous/anxious.        Objective:   Physical Exam Filed Vitals:   03/24/14 1539  BP: 114/60  Pulse: 85  Height: 5\' 2"  (1.575 m)  Weight: 161 lb (73.029 kg)  SpO2: 98%   RA  Ambulated 500 feet on RA and O2 saturation was normal  Gen: elderly female, chronically ill appearing, no acute distress HEENT: NCAT, PERRL, conjunctival pallor noted, EOMi, OP clear, neck supple without masses PULM: CTA B CV: RRR, no mgr, no JVD AB: BS+, soft, nontender, no hsm Ext: warm, no edema, no clubbing, no cyanosis Derm: no rash or skin breakdown Neuro: A&Ox4, CN II-XII intact, strength 5/5 in all 4 extremities       Assessment & Plan:   Normocytic Anemia Plan to her today that her dyspnea is complicated by her normocytic anemia. Clearly, in the last several months she has struggled with this after the complication from her tooth extraction. Based on my review of her lab work she remains quite anemic. She and her daughter are unaware of any sort of blood loss anemia aside from the known incident back in August.  On review of her RBC indices it looks as if she is normocytic but her RDW is elevated which I think accounts for the fact that she has had some blood loss and her current RBC indices are  reflective of her recent blood transfusions.  Plan: -Request PCP records to see if the occult blood has been tested in stool -Reticulocyte count, B12, folate, TSH, ferritin, cbc  Chronic obstructive asthma Elizabeth Wallace describes worsening shortness of breath in the last several months. Objectively, she has normal lungs, and normal oximetry on ambulation. However, her lung function testing is significant for mild restriction with airflow obstruction. Airflow obstruction appears to be most significant in the small airways and she does appear to have air trapping with the elevated residual volume. Considering the fact that she has never smoked I do not feel that this is indicative of COPD. The syndrome could be consistent with chronic obstructive asthma but that would be unusual considering her lack of environmental triggers and her advanced age. 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  I am happy that she gets benefit from albuterol, I hope this means that she'll continue to get symptomatic benefit from a long-acting bronchodilator and inhaled corticosteroid.  Plan: -Test for rheumatoid arthritis with blood work -[facility] -CXR -If blood work is suggestive of rheumatoid arthritis then with obtaining a CT chest to look for interstitial lung disease.  Restrictive lung disease As above  Updated Medication List Outpatient Encounter Prescriptions as of 03/24/2014  Medication Sig  . ALPRAZolam (XANAX) 0.5 MG tablet Take 0.5 mg by mouth 3 (three) times daily.   . cefUROXime (CEFTIN) 500 MG tablet   . digoxin (LANOXIN) 0.125 MG tablet Take 1 tablet (0.125 mg total) by mouth daily.  Marland Kitchen donepezil (ARICEPT) 5 MG tablet   . fexofenadine (ALLEGRA) 180 MG tablet Take 180 mg by mouth daily.  . furosemide (LASIX) 40 MG tablet Take 40 mg by mouth daily.  Marland Kitchen glipiZIDE (GLUCOTROL XL) 2.5 MG 24 hr tablet Take 2.5 mg by mouth daily.  Marland Kitchen  levocetirizine (XYZAL) 5 MG tablet Take 1 tablet by mouth daily.  . metoprolol succinate (TOPROL-XL) 50 MG 24 hr tablet Take 50 mg by mouth daily.   Marland Kitchen olopatadine (PATANOL) 0.1 % ophthalmic solution Place 1 drop into both eyes 2 (two) times daily.  . polyethylene glycol (MIRALAX / GLYCOLAX) packet Take 17 g by mouth daily.   . potassium chloride (KLOR-CON) 10 MEQ CR tablet Take 10 mEq by mouth daily.   . predniSONE (DELTASONE) 10 MG tablet Take 10 mg by mouth daily as needed.   . raloxifene (EVISTA) 60 MG tablet Take 60 mg by mouth daily.    . simvastatin (ZOCOR) 40 MG tablet Take 40 mg by mouth at bedtime.    . sucralfate (CARAFATE) 1 GM/10ML suspension Take 1 g by mouth 3 (three) times daily.   . traMADol-acetaminophen (ULTRACET) 37.5-325 MG per tablet Take 2 tablets by mouth 3 (three) times daily.   Marland Kitchen triamcinolone cream (KENALOG) 0.1 % Apply 1 application topically 2 (two) times daily.  . VOLTAREN 1 % GEL Apply 2 g topically 4 (four) times daily.   Marland Kitchen warfarin (COUMADIN) 5 MG tablet Take 2.5-5 mg by mouth See admin instructions. 1 tablet (5 mg) daily EXCEPT for 1/2 tablet (2.5 mg) on T/Th/Sun  . [DISCONTINUED] HYDROcodone-acetaminophen (NORCO/VICODIN) 5-325 MG per tablet   . [DISCONTINUED] polyethylene glycol powder (GLYCOLAX/MIRALAX) powder

## 2014-03-24 NOTE — Patient Instructions (Signed)
We will test your blood for anemia  Take symbicort two puffs twice a day no matter how you feel Use the albuterol 2 puffs every four hours as needed for shortness of breath  We will see you back in 2-3 weeks or sooner if needed

## 2014-03-24 NOTE — Assessment & Plan Note (Signed)
As above.

## 2014-03-25 LAB — CYCLIC CITRUL PEPTIDE ANTIBODY, IGG: Cyclic Citrullin Peptide Ab: <2 U/mL (ref 0.0–5.0)

## 2014-03-25 LAB — FERRITIN: FERRITIN: 12.2 ng/mL (ref 10.0–291.0)

## 2014-03-25 LAB — RHEUMATOID FACTOR: Rhuematoid fact SerPl-aCnc: 19 IU/mL — ABNORMAL HIGH (ref <=14)

## 2014-03-27 LAB — METHYLMALONIC ACID, SERUM: METHYLMALONIC ACID, QUANT: 410 nmol/L — AB (ref 87–318)

## 2014-03-28 ENCOUNTER — Encounter: Payer: Self-pay | Admitting: Pulmonary Disease

## 2014-03-29 NOTE — Progress Notes (Signed)
Quick Note:  lmtcb X1 ______ 

## 2014-03-30 NOTE — Progress Notes (Signed)
Quick Note:  atc line busy ______

## 2014-04-06 ENCOUNTER — Encounter: Payer: Self-pay | Admitting: Cardiovascular Disease

## 2014-04-08 ENCOUNTER — Telehealth: Payer: Self-pay | Admitting: Cardiovascular Disease

## 2014-04-08 NOTE — Telephone Encounter (Signed)
[name] (daughter) called stating that her mother Elizabeth Wallace is Inpatient at [facility] with shortness of breath, leg edema. Daughter is wanting to speak directly with Dr. Bronson Ing in regards to her mother's care. Please call Hassan Rowan [phone]

## 2014-04-08 NOTE — Telephone Encounter (Signed)
Can you call and see what her questions are? If I am able to review documentation, studies, etc, I may be better able to give advice.

## 2014-04-08 NOTE — Telephone Encounter (Signed)
Left message to return call 

## 2014-04-08 NOTE — Progress Notes (Signed)
Quick Note:  ATC pt x 4. Line busy. WCB. ______

## 2014-04-11 ENCOUNTER — Encounter: Payer: Self-pay | Admitting: Cardiology

## 2014-04-11 ENCOUNTER — Encounter: Payer: Self-pay | Admitting: Cardiovascular Disease

## 2014-04-12 ENCOUNTER — Ambulatory Visit: Payer: Medicare Other | Admitting: Pulmonary Disease

## 2014-04-18 NOTE — Telephone Encounter (Signed)
Patient has OV scheduled on 04/20/14 with Dr. Bronson Ing.  Currently at [facility].

## 2014-04-20 ENCOUNTER — Encounter: Payer: Self-pay | Admitting: Cardiovascular Disease

## 2014-04-20 ENCOUNTER — Ambulatory Visit (INDEPENDENT_AMBULATORY_CARE_PROVIDER_SITE_OTHER): Payer: Medicare Other | Admitting: Cardiovascular Disease

## 2014-04-20 VITALS — BP 115/76 | HR 114 | Ht 62.0 in | Wt 161.0 lb

## 2014-04-20 DIAGNOSIS — I350 Nonrheumatic aortic (valve) stenosis: Secondary | ICD-10-CM

## 2014-04-20 DIAGNOSIS — F329 Major depressive disorder, single episode, unspecified: Secondary | ICD-10-CM

## 2014-04-20 DIAGNOSIS — Z9289 Personal history of other medical treatment: Secondary | ICD-10-CM

## 2014-04-20 DIAGNOSIS — I872 Venous insufficiency (chronic) (peripheral): Secondary | ICD-10-CM

## 2014-04-20 DIAGNOSIS — I482 Chronic atrial fibrillation, unspecified: Secondary | ICD-10-CM

## 2014-04-20 DIAGNOSIS — Z87898 Personal history of other specified conditions: Secondary | ICD-10-CM

## 2014-04-20 DIAGNOSIS — I8390 Asymptomatic varicose veins of unspecified lower extremity: Secondary | ICD-10-CM

## 2014-04-20 DIAGNOSIS — I868 Varicose veins of other specified sites: Secondary | ICD-10-CM

## 2014-04-20 DIAGNOSIS — J988 Other specified respiratory disorders: Secondary | ICD-10-CM

## 2014-04-20 DIAGNOSIS — F32A Depression, unspecified: Secondary | ICD-10-CM

## 2014-04-20 DIAGNOSIS — R0609 Other forms of dyspnea: Secondary | ICD-10-CM

## 2014-04-20 DIAGNOSIS — Z954 Presence of other heart-valve replacement: Secondary | ICD-10-CM

## 2014-04-20 DIAGNOSIS — Z952 Presence of prosthetic heart valve: Secondary | ICD-10-CM

## 2014-04-20 MED ORDER — METOPROLOL SUCCINATE ER 50 MG PO TB24: 50.0000 mg | ORAL_TABLET | Freq: Every day | ORAL | Status: AC

## 2014-04-20 NOTE — Progress Notes (Signed)
Patient ID: Elizabeth Wallace, female   DOB: April 01, 1934, 80 y.o.   MRN: [id number]      SUBJECTIVE: Elizabeth Wallace has a cardiac history which is notable for permanent atrial fibrillation and valvular heart disease, status post mechanical MVR and bioprosthetic TVR. She also has history of atypical CP with a negative catheterization in 2002. She has chronic peripheral edema with venous varicosities. She also has depression, and her husband recently passed away of lung cancer.  Her echo in 01/2014 demonstrated normal LV systolic function, mild AS and moderate AI, and normally functioning prosthetic mitral and tricuspid valves albeit poorly visualized.  She underwent a nuclear stress test for fatigue and exertional dyspnea on 11/13/13 which was deemed low risk, demonstrating a small very mild partially reversible anteroseptal wall defect with normal wall motion, potentially due to variation in  breast positioning, but a small area of ischemia could not entirely be ruled out. LVEF 77%.  She was hospitalized for bleeding gums after dental procedure in August and was in rapid atrial fibrillation deemed secondary to anemia.   I personally reviewed the records of her most recent hospitalization at [facility] on 04/11/2014. She had worsening shortness of breath and was reportedly admitted for acute systolic congestive heart failure. She was noted to be hypoxic with saturations of 87% on room air. BNP was elevated at 566. She spent a brief period of time at a skilled nursing facility thereafter.  Pulmonary function tests which I had ordered demonstrated a severe airway obstruction with a concurrent restrictive process.  Hgb 9.3 on 10/15 with other relevant labs indicating Vitamin B 12 deficiency. CCP was negative for rheumatoid arthritis.  Her dyspnea is "about the same". She denies chest pain. She has not been wearing compression stockings. She was recently taken off oxygen.   She is here with her  daughter, Elizabeth Wallace, who's husband Elizabeth Wallace is also my patient.   Review of Systems: As per "subjective", otherwise negative.  Allergies  Allergen Reactions  . Oxycodone-Acetaminophen     REACTION: gi upset    Current Outpatient Prescriptions  Medication Sig Dispense Refill  . acetaminophen (TYLENOL) 500 MG tablet Take 500 mg by mouth every 6 (six) hours as needed.    Marland Kitchen albuterol (PROVENTIL) (2.5 MG/3ML) 0.083% nebulizer solution Take 2.5 mg by nebulization every 6 (six) hours as needed for wheezing or shortness of breath.    . ALPRAZolam (XANAX) 0.5 MG tablet Take 0.5 mg by mouth 3 (three) times daily.     Marland Kitchen atorvastatin (LIPITOR) 20 MG tablet Take 20 mg by mouth daily.    . furosemide (LASIX) 40 MG tablet Take 40 mg by mouth daily.    Marland Kitchen glipiZIDE (GLUCOTROL XL) 2.5 MG 24 hr tablet Take 2.5 mg by mouth daily.    . metoprolol succinate (TOPROL-XL) 50 MG 24 hr tablet Take 50 mg by mouth daily.     . potassium chloride (KLOR-CON) 10 MEQ CR tablet Take 10 mEq by mouth daily.     . Pseudoephedrine-DM-GG (ROBITUSSIN CF PO) Take 10 mLs by mouth as needed.    . warfarin (COUMADIN) 5 MG tablet Take 2.5-5 mg by mouth See admin instructions. 1 tablet (5 mg) daily EXCEPT for 1/2 tablet (2.5 mg) on T/Th/Sun    . predniSONE (DELTASONE) 10 MG tablet Take 10 mg by mouth daily as needed.      No current facility-administered medications for this visit.    Past Medical History  Diagnosis Date  . Chronic atrial  fibrillation     currently normal sinus rhythm  . Chronic anticoagulation   . Irritable bowel syndrome     constipation  . Atypical chest pain     Negative catheterization 2002, low risk Cardiolite study November 2011  . Moderate aortic stenosis by prior echocardiogram      asymptomatic  . Depression   . LV dysfunction     echocardiogram March 2013 by her primary care physician ejection fraction 40-45%, moderate aortic stenosis, prosthetic valves poorly reported , small pericardial  effusion.  . Intermittent claudication     NL ABIs, 12/2011  . Myocardial infarction 1962  . DVT (deep venous thrombosis) 1962    LLE  . Heart murmur   . Type II diabetes mellitus   . Anemia   . History of blood transfusion 1971    "got real sick during pregnancy"  . GERD (gastroesophageal reflux disease)   . Migraine     "a few times/yr" (01/26/2014)  . Arthritis     "knees, back" (01/26/2014)  . Osteoporosis   . Chronic lower back pain   . Anxiety   . Nephrolithiasis     "I have a large stone in my left kidney" (01/26/2014)  . Skin cancer   . CAD (coronary artery disease)   . Varicose veins     Past Surgical History  Procedure Laterality Date  . Multiple tooth extractions  01/25/2014    "had 4 pulled"  . Cholecystectomy    . Appendectomy    . Hernia repair    . Umbilical hernia repair    . Tricuspid valve replacement  10/2000    bioprosthetic valve  . Patent foramen ovale closure  1971  . Mitral valve replacement  10/2000     St. Jude's bileaflet prosthesis  . Cardiac catheterization  ~ 3 times  . Skin cancer excision      "off my nose"  . Abdominal hysterectomy  1970's    History   Social History  . Marital Status: Married    Spouse Name: N/A    Number of Children: N/A  . Years of Education: N/A   Occupational History  . Not on file.   Social History Main Topics  . Smoking status: Never Smoker   . Smokeless tobacco: Never Used  . Alcohol Use: No  . Drug Use: No  . Sexual Activity: No   Other Topics Concern  . Not on file   Social History Narrative     Filed Vitals:   04/20/14 1439  BP: 115/76  Pulse: 114  Height: 5\' 2"  (1.575 m)  Weight: 161 lb (73.029 kg)    PHYSICAL EXAM General: NAD, flat affect  Neck: No JVD.  Lungs: Diminished b/l, no rales or wheezes. CV: Nondisplaced PMI. Mostly regular with some mild irregularlity, S1 click/S2, no S3, II/VI mid-peaking systolic murmur loudest at RUSB. No peripheral edema, wearing compression  stockings. Abdomen: Soft, no distention.  Neurologic: Alert and oriented. Psych: Flat affect. Skin: Normal. Musculoskeletal: No gross deformities. Extremities: No cyanosis.   ECG: Most recent ECG reviewed.    ASSESSMENT AND PLAN: 1. Permanent atrial fibrillation: Rate is elevated. Anticoagulated with warfarin. Will increase Toprol-XL to 50 mg q am and 25 mg q pm.  2. S/p MVR and TVR: Stable as per 01/2014 echo.  3. LV dysfunction: Resolved, evidenced by echocardiogram in November 2014 and confirmed again in August 2015.  4. Lower extremity swelling/chronic venous insufficiency/DVT: She has venous insufficiency and nonocclusive DVT, and  I recommend continued use of support hose and leg elevation with pillows while sleeping. She is on warfarin. I previously made a referral to vascular surgery and she is now followed by them. 5. Aortic stenosis: Mild by echo in 01/2014, with moderate AI.  6. Shortness of breath and fatigue: PFT results as noted above, and likely due to prolonged exposure to secondhand smoke. I have already made a referral to pulmonary.  Dispo: f/u 6 months.   Kate Sable, M.D., F.A.C.C.

## 2014-04-20 NOTE — Patient Instructions (Signed)
Your physician recommends that you schedule a follow-up appointment in: 6 months. You will receive a reminder letter in the mail in about 4 months reminding you to call and schedule your appointment. If you don't receive this letter, please contact our office. Your physician has recommended you make the following change in your medication:  Increase toprol xl to 50 mg in the morning and 25 mg in the evening. Continue all other medications the same.

## 2014-04-21 ENCOUNTER — Encounter: Payer: Self-pay | Admitting: *Deleted

## 2014-04-21 NOTE — Progress Notes (Signed)
Quick Note:  Called and number is now d/c'ed Letter mailed to the pt ______

## 2014-05-23 ENCOUNTER — Encounter: Payer: Self-pay | Admitting: Vascular Surgery

## 2014-05-24 ENCOUNTER — Encounter: Payer: Self-pay | Admitting: Vascular Surgery

## 2014-05-24 ENCOUNTER — Ambulatory Visit (INDEPENDENT_AMBULATORY_CARE_PROVIDER_SITE_OTHER): Payer: Medicare Other | Admitting: Vascular Surgery

## 2014-05-24 VITALS — BP 120/60 | HR 78 | Resp 18 | Ht 62.0 in | Wt 167.0 lb

## 2014-05-24 DIAGNOSIS — I83892 Varicose veins of left lower extremities with other complications: Secondary | ICD-10-CM

## 2014-05-24 NOTE — Progress Notes (Signed)
Subjective:     Patient ID: Elizabeth Wallace, female   DOB: [birth year], 80 y.o.   MRN: [id number]  HPI this 80 year old female seen today in continued follow-up regarding her venous stasis disease. She has gross reflux in the left great saphenous vein and a history of multiple venous stasis ulcers in the medial aspect of the left lower leg. Currently the ulcers are healed. She has chronic edema. She also has chronic congestive heart failure and has previously had mitral valve replacement with a mechanical valve and tricuspid valve replacement with a bioprosthetic valve as well as chronic atrial fibrillation. She is on chronic Coumadin therapy.  Past Medical History  Diagnosis Date  . Chronic atrial fibrillation     currently normal sinus rhythm  . Chronic anticoagulation   . Irritable bowel syndrome     constipation  . Atypical chest pain     Negative catheterization 2002, low risk Cardiolite study November 2011  . Moderate aortic stenosis by prior echocardiogram      asymptomatic  . Depression   . LV dysfunction     echocardiogram March 2013 by her primary care physician ejection fraction 40-45%, moderate aortic stenosis, prosthetic valves poorly reported , small pericardial effusion.  . Intermittent claudication     NL ABIs, 12/2011  . Myocardial infarction 1962  . DVT (deep venous thrombosis) 1962    LLE  . Heart murmur   . Type II diabetes mellitus   . Anemia   . History of blood transfusion 1971    "got real sick during pregnancy"  . GERD (gastroesophageal reflux disease)   . Migraine     "a few times/yr" (01/26/2014)  . Arthritis     "knees, back" (01/26/2014)  . Osteoporosis   . Chronic lower back pain   . Anxiety   . Nephrolithiasis     "I have a large stone in my left kidney" (01/26/2014)  . Skin cancer   . CAD (coronary artery disease)   . Varicose veins     History  Substance Use Topics  . Smoking status: Never Smoker   . Smokeless tobacco: Never Used  . Alcohol  Use: No    Family History  Problem Relation Age of Onset  . Diabetes Mother   . CAD Mother   . Heart disease Mother     before age 70  . Heart failure Father   . Heart disease Father     before age 63  . Stroke Sister 31  . Cancer Sister   . Varicose Veins Sister   . Heart attack Sister   . Cancer Brother   . Cancer Daughter   . Heart disease Daughter     before age 23  . Heart disease Son     before age 52  . Hyperlipidemia Son   . Heart attack Son   . Cancer Sister   . Cancer Sister   . Cancer Brother     Allergies  Allergen Reactions  . Oxycodone-Acetaminophen     REACTION: gi upset    Current outpatient prescriptions: acetaminophen (TYLENOL) 500 MG tablet, Take 500 mg by mouth every 6 (six) hours as needed., Disp: , Rfl: ;  albuterol (PROVENTIL) (2.5 MG/3ML) 0.083% nebulizer solution, Take 2.5 mg by nebulization every 6 (six) hours as needed for wheezing or shortness of breath., Disp: , Rfl: ;  ALPRAZolam (XANAX) 0.5 MG tablet, Take 0.5 mg by mouth 3 (three) times daily. , Disp: , Rfl:  atorvastatin (LIPITOR)  20 MG tablet, Take 20 mg by mouth daily., Disp: , Rfl: ;  furosemide (LASIX) 40 MG tablet, Take 40 mg by mouth daily., Disp: , Rfl: ;  glipiZIDE (GLUCOTROL XL) 2.5 MG 24 hr tablet, Take 2.5 mg by mouth daily., Disp: , Rfl:  metoprolol succinate (TOPROL-XL) 50 MG 24 hr tablet, Take 1 tablet (50 mg total) by mouth daily with breakfast. & 25 mg in the evening (Patient taking differently: Take 100 mg by mouth daily with breakfast. & 25 mg in the evening), Disp: 45 tablet, Rfl: 6;  potassium chloride (KLOR-CON) 10 MEQ CR tablet, Take 10 mEq by mouth daily. , Disp: , Rfl: ;  predniSONE (DELTASONE) 10 MG tablet, Take 10 mg by mouth daily as needed. , Disp: , Rfl:  Pseudoephedrine-DM-GG (ROBITUSSIN CF PO), Take 10 mLs by mouth as needed., Disp: , Rfl: ;  warfarin (COUMADIN) 5 MG tablet, Take 2.5-5 mg by mouth See admin instructions. 1 tablet (5 mg) daily EXCEPT for 1/2 tablet  (2.5 mg) on T/Th/Sun, Disp: , Rfl:   BP 120/60 mmHg  Pulse 78  Resp 18  Ht 5\' 2"  (1.575 m)  Wt 167 lb (75.751 kg)  BMI 30.54 kg/m2  Body mass index is 30.54 kg/(m^2).           Review of Systems denies active chest pain but does have dyspnea on exertion   continues to have chronic swelling in both legs. Denies hemoptysis and orthopnea Objective:   Physical Exam BP 120/60 mmHg  Pulse 78  Resp 18  Ht 5\' 2"  (1.575 m)  Wt 167 lb (75.751 kg)  BMI 30.54 kg/m2  General chronically ill-appearing female in no apparent distress alert and oriented 3 Bilateral lower extremity edema at 1-2+. Large scarred area adjacent to left medial malleolus where stasis ulcer has healed. Chronic thickening with 2+ dorsalis pedis pulse palpable. Scattered varicosities right patellar and pretibial region.  Patient has documented gross reflux left great saphenous vein in the thigh area up to the junction.     Assessment:     History recurrent stasis ulcers left leg with chronic edema and thickening of skin with gross reflux left great saphenous system Chronic Coumadin therapy for mitral valve replacement-mechanical valve and chronic atrial fibrillation    Plan:     Patient needs laser ablation left great saphenous vein Will need to be off Coumadin therapy for the procedure. Will likely need to be bridged with Lovenox. We will discuss this with daughter and cardiologist and make plans to proceed in the near future.

## 2014-06-08 ENCOUNTER — Ambulatory Visit (INDEPENDENT_AMBULATORY_CARE_PROVIDER_SITE_OTHER): Payer: Medicare Other | Admitting: Cardiovascular Disease

## 2014-06-08 ENCOUNTER — Encounter: Payer: Self-pay | Admitting: Cardiovascular Disease

## 2014-06-08 VITALS — BP 107/67 | HR 64 | Ht 62.0 in | Wt 171.0 lb

## 2014-06-08 DIAGNOSIS — I872 Venous insufficiency (chronic) (peripheral): Secondary | ICD-10-CM

## 2014-06-08 DIAGNOSIS — I482 Chronic atrial fibrillation, unspecified: Secondary | ICD-10-CM

## 2014-06-08 DIAGNOSIS — I868 Varicose veins of other specified sites: Secondary | ICD-10-CM

## 2014-06-08 DIAGNOSIS — F329 Major depressive disorder, single episode, unspecified: Secondary | ICD-10-CM

## 2014-06-08 DIAGNOSIS — F32A Depression, unspecified: Secondary | ICD-10-CM

## 2014-06-08 DIAGNOSIS — Z954 Presence of other heart-valve replacement: Secondary | ICD-10-CM

## 2014-06-08 DIAGNOSIS — I350 Nonrheumatic aortic (valve) stenosis: Secondary | ICD-10-CM

## 2014-06-08 DIAGNOSIS — Z952 Presence of prosthetic heart valve: Secondary | ICD-10-CM

## 2014-06-08 DIAGNOSIS — I8390 Asymptomatic varicose veins of unspecified lower extremity: Secondary | ICD-10-CM

## 2014-06-08 NOTE — Progress Notes (Signed)
Patient ID: Martin Majestic, female   DOB: 1934/06/05, 80 y.o.   MRN: [id number]      SUBJECTIVE: Mrs. Marty has chronic venous stasis with healed ulcers at present and chronic lower extremity edema. She has gross reflux of the left great saphenous vein which requires laser ablation, which will require being off of warfarin. She has been asked to see me by Dr. Kellie Simmering prior to undergoing this procedure. The patient says she is not interested in undergoing any procedures at her age and with her multiple comorbidities. She said her PCP recommended she not go through with it either. She apparently had an echocardiogram performed on 04/07/2014 at [facility], which demonstrated normal left ventricular systolic function, EF 10-93%, moderate right ventricular enlargement, with moderate aortic regurgitation and moderate aortic stenosis. She had mild aortic stenosis by echocardiography in August 2015. Mechanical mitral valve and prosthetic tricuspid valves were noted but there was significant acoustic shadowing. RVSP was calculated at 55 mmHg.   Review of Systems: As per "subjective", otherwise negative.  Allergies  Allergen Reactions  . Oxycodone-Acetaminophen     REACTION: gi upset    Current Outpatient Prescriptions  Medication Sig Dispense Refill  . acetaminophen (TYLENOL) 500 MG tablet Take 500 mg by mouth every 6 (six) hours as needed.    Marland Kitchen albuterol (PROVENTIL) (2.5 MG/3ML) 0.083% nebulizer solution Take 2.5 mg by nebulization every 6 (six) hours as needed for wheezing or shortness of breath.    . ALPRAZolam (XANAX) 0.5 MG tablet Take 0.5 mg by mouth 3 (three) times daily.     Marland Kitchen atorvastatin (LIPITOR) 20 MG tablet Take 20 mg by mouth daily.    . furosemide (LASIX) 40 MG tablet Take 40 mg by mouth daily.    Marland Kitchen glipiZIDE (GLUCOTROL XL) 2.5 MG 24 hr tablet Take 2.5 mg by mouth daily.    . metoprolol succinate (TOPROL-XL) 50 MG 24 hr tablet Take 1 tablet (50 mg total) by mouth daily  with breakfast. & 25 mg in the evening (Patient taking differently: Take 100 mg by mouth daily with breakfast. & 25 mg in the evening) 45 tablet 6  . potassium chloride (KLOR-CON) 10 MEQ CR tablet Take 10 mEq by mouth daily.     . predniSONE (DELTASONE) 10 MG tablet Take 10 mg by mouth daily as needed.     . Pseudoephedrine-DM-GG (ROBITUSSIN CF PO) Take 10 mLs by mouth as needed.    . warfarin (COUMADIN) 5 MG tablet Take 2.5-5 mg by mouth See admin instructions. 1 tablet (5 mg) daily EXCEPT for 1/2 tablet (2.5 mg) on T/Th/Sun     No current facility-administered medications for this visit.    Past Medical History  Diagnosis Date  . Chronic atrial fibrillation     currently normal sinus rhythm  . Chronic anticoagulation   . Irritable bowel syndrome     constipation  . Atypical chest pain     Negative catheterization 2002, low risk Cardiolite study November 2011  . Moderate aortic stenosis by prior echocardiogram      asymptomatic  . Depression   . LV dysfunction     echocardiogram March 2013 by her primary care physician ejection fraction 40-45%, moderate aortic stenosis, prosthetic valves poorly reported , small pericardial effusion.  . Intermittent claudication     NL ABIs, 12/2011  . Myocardial infarction 1962  . DVT (deep venous thrombosis) 1962    LLE  . Heart murmur   . Type II diabetes mellitus   .  Anemia   . History of blood transfusion 1971    "got real sick during pregnancy"  . GERD (gastroesophageal reflux disease)   . Migraine     "a few times/yr" (01/26/2014)  . Arthritis     "knees, back" (01/26/2014)  . Osteoporosis   . Chronic lower back pain   . Anxiety   . Nephrolithiasis     "I have a large stone in my left kidney" (01/26/2014)  . Skin cancer   . CAD (coronary artery disease)   . Varicose veins     Past Surgical History  Procedure Laterality Date  . Multiple tooth extractions  01/25/2014    "had 4 pulled"  . Cholecystectomy    . Appendectomy    .  Hernia repair    . Umbilical hernia repair    . Tricuspid valve replacement  10/2000    bioprosthetic valve  . Patent foramen ovale closure  1971  . Mitral valve replacement  10/2000     St. Jude's bileaflet prosthesis  . Cardiac catheterization  ~ 3 times  . Skin cancer excision      "off my nose"  . Abdominal hysterectomy  1970's    History   Social History  . Marital Status: Married    Spouse Name: N/A    Number of Children: N/A  . Years of Education: N/A   Occupational History  . Not on file.   Social History Main Topics  . Smoking status: Never Smoker   . Smokeless tobacco: Never Used  . Alcohol Use: No  . Drug Use: No  . Sexual Activity: No   Other Topics Concern  . Not on file   Social History Narrative     Filed Vitals:   06/08/14 1349  BP: 107/67  Pulse: 64  Height: 5\' 2"  (1.575 m)  Weight: 171 lb (77.565 kg)  SpO2: 99%    PHYSICAL EXAM General: NAD, flat affect  Neck: No JVD.  Lungs: No rales or wheezes. CV: Nondisplaced PMI. Mostly regular with some mild irregularlity, S1 click/S2, no S3, II/VI mid-peaking systolic murmur loudest at RUSB and II/IV holodiastolic murmur along lower left sternal border. +Peripheral edema, wearing compression stockings. Abdomen: Soft, no distention.  Neurologic: Alert and oriented. Psych: Flat affect. Skin: Normal. Musculoskeletal: No gross deformities. Extremities: No cyanosis.   ECG: Most recent ECG reviewed.    ASSESSMENT AND PLAN: 1. Permanent atrial fibrillation: HR is controlled. She is not certain if she is now taking 100 mg Toprol-XL q am and 25 mg q pm. Anticoagulated with warfarin.  2. S/p MVR and TVR: Stable as per 02/2014 echo. However, due to acoustic shadowing, [facility] cardiologist recommended a TEE be considered if deemed necessary. Will hold off for time being as I am not certain this will change management. 3. LV dysfunction: Resolved, evidenced by echocardiogram in November 2014, August 2015, and  now October 2015. 4. Lower extremity swelling/chronic venous insufficiency/DVT: She has chronic venous insufficiency and nonocclusive DVT. She is on warfarin. Laser ablation of left great saphenous vein recommended for significant reflux disease by vascular surgery, but pt prefers to hold off for time being. I think she could safely undergo procedure with good BP control, albeit she is at an elevated risk for major adverse cardiac events given her multiple comorbidities. 5. Aortic stenosis: Mild by echo in 01/2014, moderate by echo in 03/2014, with moderate AI. No changes to management. 6. Shortness of breath and fatigue: PFT results previously noted, and I have  already made a referral to pulmonary.  Dispo: f/u 6 months.   Kate Sable, M.D., F.A.C.C.

## 2014-06-08 NOTE — Patient Instructions (Signed)
Continue all current medications. Your physician wants you to follow up in: 6 months.  You will receive a reminder letter in the mail one-two months in advance.  If you don't receive a letter, please call our office to schedule the follow up appointment   

## 2014-10-09 DEATH — deceased

## 2014-11-12 IMAGING — NM NM MYOCAR SINGLE W/SPECT W/WALL MOTION & EF
2 series · 12 of 12 positions shown · non-contrast
Comparison: None.

CLINICAL DATA: 79-year-old female with no known history of coronary
artery disease referred for shortness of breath.

EXAM:
MYOCARDIAL IMAGING WITH SPECT (REST AND PHARMACOLOGIC-STRESS)
GATED LEFT VENTRICULAR WALL MOTION STUDY
LEFT VENTRICULAR EJECTION FRACTION
TECHNIQUE: Standard myocardial SPECT imaging was performed after resting
intravenous injection of 10 mCi Bc-00m sestamibi. Subsequently,
intravenous infusion of Lexiscan was performed under the supervision
of the Cardiology staff. At peak effect of the drug, 30 mCi Bc-00m
sestamibi was injected intravenously and standard myocardial SPECT
imaging was performed. Quantitative gated imaging was also performed
to evaluate left ventricular wall motion, and estimate left
ventricular ejection fraction.

[Series 1: rest · 8.28mm/px · 6 of 64 frames shown]
[frame 6/64]
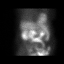
[frame 16/64]
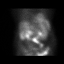
[frame 27/64]
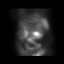
[frame 38/64]
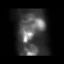
[frame 48/64]
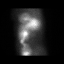
[frame 59/64]
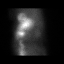

[Series 2: stress gated · 8.28mm/px · 6 of 512 frames shown]
[frame 43/512]
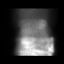
[frame 128/512]
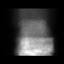
[frame 214/512]
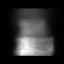
[frame 299/512]
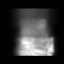
[frame 384/512]
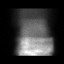
[frame 470/512]
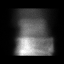

[12 of 12 positions shown; findings below may reference images not displayed]

FINDINGS: Pharmacological stress

Baseline EKG showed afib with non-specific ST/T changes. Baseline
heart rate increased from 73 beats per min up to 131 beats per min
and baseline blood pressure increased from 106/64 up to 138/87 after
injection. The test was stopped after injection was complete, the
patient did not experience any chest pain.

Post-injection EKG showed nonspecific ST/T changes and atrial
fibrillation. Baseline inferior and anterolateral ST depressions
became more prominent after injection.

Raw images showed appropriate radiotracer uptake. There is a small
very mild partially reversible anteroseptal wall defect. This area
has normal wall motion. This may reflect mild differences in breast
positioning during the test, cannot exclude small area of ischemia.
There are no other myocardial perfusion defects.

Gated imaging showed end-diastolic volume 42 mL, and systolic volume
9 mL, left ventricular ejection fraction 77%, t.i.d. 0.89.
IMPRESSION: 1.  Abnormal Lexiscan MPI

2. Small very mild partially reversible anteroseptal wall defect,
this area has normal wall motion. Potentially due to variation in
breast positioning, cannot rule out small area of ischemia. Overall
low risk study

3.  Normal LV systolic function, LVEF 77%.
